# Patient Record
Sex: Female | Born: 1995
Health system: Southern US, Community
[De-identification: ages and names within clinical notes are randomized; demographics above are authoritative.]

## PROBLEM LIST (undated history)

## (undated) DIAGNOSIS — O139 Gestational [pregnancy-induced] hypertension without significant proteinuria, unspecified trimester: Secondary | ICD-10-CM

## (undated) DIAGNOSIS — O1490 Unspecified pre-eclampsia, unspecified trimester: Secondary | ICD-10-CM

## (undated) DIAGNOSIS — H9190 Unspecified hearing loss, unspecified ear: Secondary | ICD-10-CM

## (undated) DIAGNOSIS — Q8583 Von Hippel-Lindau syndrome: Secondary | ICD-10-CM

## (undated) DIAGNOSIS — K862 Cyst of pancreas: Secondary | ICD-10-CM

## (undated) DIAGNOSIS — Q858 Other phakomatoses, not elsewhere classified: Secondary | ICD-10-CM

## (undated) HISTORY — DX: Unspecified hearing loss, unspecified ear: H91.90

## (undated) HISTORY — DX: Unspecified pre-eclampsia, unspecified trimester: O14.90

## (undated) HISTORY — DX: Cyst of pancreas: K86.2

## (undated) HISTORY — DX: Gestational (pregnancy-induced) hypertension without significant proteinuria, unspecified trimester: O13.9

## (undated) HISTORY — PX: WISDOM TOOTH EXTRACTION: SHX21

---
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
## 2015-09-21 ENCOUNTER — Encounter (HOSPITAL_COMMUNITY): Payer: Self-pay | Admitting: Emergency Medicine

## 2015-09-21 ENCOUNTER — Emergency Department (HOSPITAL_COMMUNITY)
Admission: EM | Admit: 2015-09-21 | Discharge: 2015-09-22 | Disposition: A | Discharge status: Home / Self Care | Payer: 59 | Attending: Emergency Medicine | Admitting: Emergency Medicine

## 2015-09-21 ENCOUNTER — Emergency Department (HOSPITAL_COMMUNITY): Payer: 59

## 2015-09-21 DIAGNOSIS — S59912A Unspecified injury of left forearm, initial encounter: Secondary | ICD-10-CM | POA: Diagnosis not present

## 2015-09-21 DIAGNOSIS — Q858 Other phakomatoses, not elsewhere classified: Secondary | ICD-10-CM | POA: Insufficient documentation

## 2015-09-21 DIAGNOSIS — W19XXXA Unspecified fall, initial encounter: Secondary | ICD-10-CM

## 2015-09-21 DIAGNOSIS — Z79899 Other long term (current) drug therapy: Secondary | ICD-10-CM | POA: Diagnosis not present

## 2015-09-21 DIAGNOSIS — Y990 Civilian activity done for income or pay: Secondary | ICD-10-CM | POA: Diagnosis not present

## 2015-09-21 DIAGNOSIS — Y9389 Activity, other specified: Secondary | ICD-10-CM | POA: Insufficient documentation

## 2015-09-21 DIAGNOSIS — Y9289 Other specified places as the place of occurrence of the external cause: Secondary | ICD-10-CM | POA: Insufficient documentation

## 2015-09-21 DIAGNOSIS — S59902A Unspecified injury of left elbow, initial encounter: Secondary | ICD-10-CM | POA: Diagnosis not present

## 2015-09-21 DIAGNOSIS — S4992XA Unspecified injury of left shoulder and upper arm, initial encounter: Secondary | ICD-10-CM | POA: Diagnosis not present

## 2015-09-21 DIAGNOSIS — M79602 Pain in left arm: Secondary | ICD-10-CM | POA: Diagnosis not present

## 2015-09-21 DIAGNOSIS — S6992XA Unspecified injury of left wrist, hand and finger(s), initial encounter: Secondary | ICD-10-CM | POA: Diagnosis not present

## 2015-09-21 DIAGNOSIS — W01198A Fall on same level from slipping, tripping and stumbling with subsequent striking against other object, initial encounter: Secondary | ICD-10-CM | POA: Insufficient documentation

## 2015-09-21 NOTE — ED Notes (Signed)
Patient reports slipping on floor at work and hitting left arm on metal rack. Denies hitting head or LOC. C/o left elbow pain radiating to left wrist with numbness and tingling to same. Rates pain 6/10.

## 2015-09-22 DIAGNOSIS — S4992XA Unspecified injury of left shoulder and upper arm, initial encounter: Secondary | ICD-10-CM | POA: Diagnosis not present

## 2015-09-22 DIAGNOSIS — Q858 Other phakomatoses, not elsewhere classified: Secondary | ICD-10-CM | POA: Diagnosis not present

## 2015-09-22 DIAGNOSIS — S6992XA Unspecified injury of left wrist, hand and finger(s), initial encounter: Secondary | ICD-10-CM | POA: Diagnosis not present

## 2015-09-22 DIAGNOSIS — S59902A Unspecified injury of left elbow, initial encounter: Secondary | ICD-10-CM | POA: Diagnosis not present

## 2015-09-22 DIAGNOSIS — Z79899 Other long term (current) drug therapy: Secondary | ICD-10-CM | POA: Diagnosis not present

## 2015-09-22 MED ORDER — IBUPROFEN 200 MG PO TABS
400.0000 mg | ORAL_TABLET | Freq: Once | ORAL | Status: AC
  Administered 2015-09-22: 400 mg via ORAL
  Filled 2015-09-22: qty 2

## 2015-09-22 MED ORDER — OXYCODONE-ACETAMINOPHEN 5-325 MG PO TABS: 1.0000 | ORAL_TABLET | ORAL | Status: DC | PRN

## 2015-09-22 MED ORDER — IBUPROFEN 800 MG PO TABS: 800.0000 mg | ORAL_TABLET | Freq: Three times a day (TID) | ORAL | Status: DC

## 2015-09-22 NOTE — Discharge Instructions (Signed)
You have been seen today for arm pain. Your imaging showed no abnormalities. Follow up with orthopedics on this matter. Follow up with PCP as needed. Return to ED should symptoms worsen.   Emergency Department Resource Guide 1) Find a Doctor and Pay Out of Pocket Although you won't have to find out who is covered by your insurance plan, it is a good idea to ask around and get recommendations. You will then need to call the office and see if the doctor you have chosen will accept you as a new patient and what types of options they offer for patients who are self-pay. Some doctors offer discounts or will set up payment plans for their patients who do not have insurance, but you will need to ask so you aren't surprised when you get to your appointment.  2) Contact Your Local Health Department Not all health departments have doctors that can see patients for sick visits, but many do, so it is worth a call to see if yours does. If you don't know where your local health department is, you can check in your phone book. The CDC also has a tool to help you locate your state's health department, and many state websites also have listings of all of their local health departments.  3) Find a [facility] If your illness is not likely to be very severe or complicated, you may want to try a walk in clinic. These are popping up all over the country in pharmacies, drugstores, and shopping centers. They're usually staffed by nurse practitioners or physician assistants that have been trained to treat common illnesses and complaints. They're usually fairly quick and inexpensive. However, if you have serious medical issues or chronic medical problems, these are probably not your best option.  No Primary Care Doctor: - Call Health Connect at  [phone] - they can help you locate a primary care doctor that  accepts your insurance, provides certain services, etc. - Physician Referral Service- [phone]  Chronic Pain  Problems: Organization         Address  Phone   Notes  [facility]  [phone] Patients need to be referred by their primary care doctor.   Medication Assistance: Organization         Address  Phone   Notes  [facility] Medication [facility] [place], [place] [address] [phone] --Must be a resident of [facility] -- Must have NO insurance coverage whatsoever (no Medicaid/ Medicare, etc.) -- The pt. MUST have a primary care doctor that directs their care regularly and follows them in the community   MedAssist  [phone]   [employer]  [phone]    Agencies that provide inexpensive medical care: Organization         Address  Phone   Notes  [place]  [phone]   [facility]    [phone]   [facility] [place], [place] [address] [phone]   [place] [address], [place] [phone]   Planned Parenthood    [phone]   [facility]    [phone]   [place] and [address], [place] Phone:  [phone], Fax:  [phone] Hours of Operation:  9 am - 6 pm, M-F.  Also accepts Medicaid/Medicare and self-pay.  [facility]  [place] [employer], [address], [place] Phone: [phone], Fax: [phone]. Hours of Operation:  8:30 am - 5:30 pm, M-F.  Also accepts Medicaid and self-pay.  [facility] [place] [address], [place] Phone: [phone]   [place], [place], [place] [phone], Ext. [phone] Mondays & Thursdays: 7-9 AM.  First 15 patients are seen on a first come, first serve basis.    [place] Providers:  Organization         Address  Phone   Notes  [facility] [address], [place] [phone] Also  accepts self-pay patients.  [facility] [address], [place]  [phone]   [place], [address], [place] [phone]   [facility] [address], [place] [phone]   Lucianne Lei [address], [place]   [phone] Only accepts [place] Access [place] patients after they have their name applied to their card.   Self-Pay (no insurance) in [facility]:  Organization         Address  Phone   Notes  Sickle Cell Patients, [facility] [place] [phone]   [facility] Urgent Care [place] [phone]   [facility] [place]  [place], [place], [place] [phone]   [facility]/Dr. Osei-Bonsu  [address] [place] or [address], [place] [phone] Phone number for both [place] and [place] locations is the same.  Urgent Medical and [facility] [address], [place] [phone]   [facility] [address] [place] or [address] [phone]   [facility] [address] [place] [phone], phone; [phone], fax Sees patients 1st and 3rd Saturday of every month.  Must not qualify for public or private insurance (i.e. Medicaid, Medicare, [place] Health Choice, Veterans' Benefits)  Household income should be no more than 200% of the poverty level The clinic cannot treat you if you are pregnant or think you are pregnant  Sexually transmitted diseases are not treated at the clinic.    Dental Care: Organization         Address  Phone  Notes  [facility] Department of [facility] [place] [phone] Accepts children up to age 37 who are enrolled in [place] or [place]; pregnant  women with a Medicaid card; and children who have applied for Medicaid or [place] Health Choice, but were declined, whose parents can pay a reduced fee at time of service.  [facility] Department of [facility]  [address], [place] [phone] Accepts children up to age 58 who are enrolled in [place] or [place]; pregnant women with a Medicaid card; and children who have applied for Medicaid or [place] Health Choice, but were declined, whose parents can pay a reduced fee at time of service.  [facility] Adult [facility] Access PROGRAM  [place] [phone] Patients are seen by appointment only. Walk-ins are not accepted. [place] will see patients 22 years of age and older. Monday - Tuesday (8am-5pm) Most Wednesdays (8:30-5pm) $30 per visit, cash only  [place] Adult  Dental Access PROGRAM  [address], [facility] [phone] Patients are seen by appointment only. Walk-ins are not accepted. [place] will see patients 60 years of age and older. One Wednesday Evening (Monthly: Volunteer Based).  $30 per visit, cash only  [place]  [phone] for adults; Children under age 4, call Graduate Pediatric Dentistry at [phone]. Children aged 49-14, please call [phone] to request a pediatric application.  Dental services are provided in all areas of dental care including fillings, crowns and bridges, complete and partial dentures, implants, gum treatment, root canals, and extractions. Preventive care is also provided. Treatment is provided to both adults and children. Patients are selected via a lottery and there is often a waiting list.   [facility] [address] [place]  [phone] [email or web address]   [facility] [address] [place], [place] [phone], Ext. [phone] Second and Fourth Thursday of each month, opens at 6:30 AM; Clinic ends at 9 AM.  Patients are  seen on a first-come first-served basis, and a limited number are seen during each clinic.   [facility]  [address] Hillard Danker [place], [place] [phone]   Eligibility Requirements You must have lived in [place], [place], or [place] counties for at least the last three months.   You cannot be eligible for state or federal sponsored [employer], including [employer], [place], or [employer].   You generally cannot be eligible for healthcare insurance through your employer.    How to apply: Eligibility screenings are held every Tuesday and Wednesday afternoon from 1:00 pm until 4:00 pm. You do not need an appointment for the interview!  [facility] [address] [place], [place]   [place]  [place] Department  [place]  [phone]    Behavioral Health Resources in the Community: Intensive Outpatient Programs Organization         Address  Phone  Notes  [place]. [address], [place], [place] [phone]   [facility] Outpatient [address], [place], [place]   ADS: Alcohol & Drug Svcs [address], [place], [place]   [place] [address],  [place], [place] or [phone]   Substance Abuse Resources Organization         Address  Phone  Notes  Alcohol and Drug Services  [phone]   [place]  [phone]   [facility] [place]   Chinita Pester  [phone]   Residential & Outpatient Substance Abuse Program  [phone]   Psychological Services Organization         Address  Phone  Notes  [facility] [place]  [place]  [phone]   [place] [address] [place] [phone] or [phone]    Mobile Crisis  Teams Organization         Address  Phone  Notes  [facility], Mobile Crisis Care Unit  [phone]   [facility] Psychotherapeutic Services  [address] [place], [place]   [place] Levels [address] [place] [phone]    Self-Help/Support Groups Organization         Address  Phone             Notes  Mental  Health Assoc. of [place] - variety of support groups  [place] Call for more information  Narcotics Anonymous (NA), [facility] [address], [employer] [place]  2 meetings at this location   Special educational needs teacher         Address  Phone  Notes  [facility] Residential Treatment [place],    [place]  [phone]   [facility]  [address], [place] [address], [place], [place]   [place], [place] [phone] Admissions: 8am-3pm M-F  Incentives Substance [place] [address],    [place], [place] [phone]   [facility] [address] [place], [place], [place]   [facility] [address]  [place], [place]   [facility] Programs - Intensive Outpatient [address], [place], [place]   [facility] ([place].,  [place], [place] [phone] or [phone]   [facility]idential Treatment Services (RTS) [address] [place], [place] Accepts Medicaid  [facility] [place] [address],  [place] [phone] Substance Abuse/Addiction Treatment   [facility] Organization         Address  Phone  Notes  [facility]  [phone]   Domenic Schwab, PhD [address] Arlis [place], [place]   [phone] or [phone]   [place], [place] [phone]   [facility] [address], [place], [place] [phone]  Insurance/Medicaid/sponsorship through [facility] and Families [address]                                    [place], [place] [phone] [place] [address], [place] [phone]    Dr. Adele Schilder  [phone]   [facility] Dept. 1) [address] [place] 2) [place] 3)  [place] [address], [place] [phone]  [phone]   [place] [phone] or [phone] (After Hours)

## 2015-09-22 NOTE — ED Provider Notes (Signed)
CSN: [id number]     Arrival date & time 09/21/15  2304 History   First MD Initiated Contact with Patient 09/22/15 0033     No chief complaint on file.    (Consider location/radiation/quality/duration/timing/severity/associated sxs/prior Treatment) HPI   LORINA DENDY is a 20 y.o. female, with a history of VHL, presenting to the ED with left arm pain. Patient states that she slipped while at work and hit her left arm on a metal rack. Patient rates her pain at 3 out of 10, throbbing, located mostly in the left elbow and left wrist, and nonradiating. Patient has not taken anything for the pain. Patient denies LOC, head trauma, neuro or functional deficits, or any other pain or complaints.    Past Medical History  Diagnosis Date  . VHL (von Hippel-Lindau syndrome) ([place])    History reviewed. No pertinent past surgical history. No family history on file. Social History  Substance Use Topics  . Smoking status: Never Smoker   . Smokeless tobacco: None  . Alcohol Use: No   OB History    No data available     Review of Systems  Musculoskeletal: Positive for arthralgias (Left elbow and left wrist). Negative for back pain and neck pain.      Allergies  Cephalexin  Home Medications   Prior to Admission medications   Medication Sig Start Date End Date Taking? Authorizing Provider  amphetamine-dextroamphetamine (ADDERALL XR) 20 MG 24 hr capsule Take 1 capsule by mouth daily. 08/18/15  Yes Historical Provider, MD  buPROPion (WELLBUTRIN XL) 150 MG 24 hr tablet Take 1 tablet by mouth daily. 08/07/15  Yes Historical Provider, MD  escitalopram (LEXAPRO) 10 MG tablet Take 1 tablet by mouth daily. 08/07/15  Yes Historical Provider, MD  etonogestrel-ethinyl estradiol (NUVARING) 0.12-0.015 MG/24HR vaginal ring Place 1 each vaginally every 28 (twenty-eight) days. Insert vaginally and leave in place for 3 consecutive weeks, then remove for 1 week.   Yes Historical Provider, MD  ibuprofen  (ADVIL,MOTRIN) 800 MG tablet Take 1 tablet (800 mg total) by mouth 3 (three) times daily. 09/22/15   Shawn C Joy, PA-C  oxyCODONE-acetaminophen (PERCOCET/ROXICET) 5-325 MG tablet Take 1-2 tablets by mouth every 4 (four) hours as needed for severe pain. 09/22/15   Lorayne Bender, PA-C   LMP 09/07/2015 Physical Exam  Constitutional: She appears well-developed and well-nourished.  HENT:  Head: Normocephalic and atraumatic.  Neck: Normal range of motion.  Cardiovascular: Normal rate.   Pulmonary/Chest: Effort normal.  Musculoskeletal:  Full ROM in all extremities and spine. No paraspinal tenderness. No swelling, erythema, or deformities noted in the left arm. Patient complains of pain to the medial elbow with upper arm flexion as well as pain to the lateral wrist with flexion and extension of the wrist.  Neurological: She is alert.  Skin: Skin is warm and dry.  Nursing note and vitals reviewed.   ED Course  Procedures (including critical care time) Labs Review Labs Reviewed - No data to display  Imaging Review Dg Forearm Left  09/22/2015  CLINICAL DATA:  20 year old female with fall and trauma to the left forearm. EXAM: LEFT FOREARM - 2 VIEW COMPARISON:  None. FINDINGS: There is no evidence of fracture or other focal bone lesions. Soft tissues are unremarkable. IMPRESSION: Negative. Electronically Signed   By: Anner Crete M.D.   On: 09/22/2015 00:13   I have personally reviewed and evaluated these images as part of my medical decision-making.   EKG Interpretation None  MDM   Final diagnoses:  Pain of left upper extremity  Fall, initial encounter    TSERING LAMPKIN presents with a slip and fall resulting in left arm pain.  Patient has no functional or neuro deficits. Patient has no signs of any other injuries. Patient is appropriate for outpatient orthopedic follow-up. The patient was given instructions for home care as well as return precautions. Patient voices  understanding of these instructions, accepts the plan, and is comfortable with discharge.    Lorayne Bender, PA-C [phone]  Delora Fuel, MD [phone]

## 2015-12-15 MED FILL — BUPROPION HCL XL 300 MG TAB: 300 | 90 days supply | Qty: 90 | Fill #0

## 2015-12-15 MED FILL — DEXTROAMP-AMPHET ER 20 MG C: 20 | 90 days supply | Qty: 90 | Fill #0

## 2015-12-22 ENCOUNTER — Ambulatory Visit (HOSPITAL_COMMUNITY)
Admission: RE | Admit: 2015-12-22 | Discharge: 2015-12-22 | Disposition: A | Discharge status: Home / Self Care | Payer: 59 | Source: Ambulatory Visit | Attending: Physician Assistant | Admitting: Physician Assistant

## 2015-12-22 ENCOUNTER — Other Ambulatory Visit (HOSPITAL_COMMUNITY): Payer: Self-pay | Admitting: Physician Assistant

## 2015-12-22 DIAGNOSIS — R7611 Nonspecific reaction to tuberculin skin test without active tuberculosis: Secondary | ICD-10-CM | POA: Diagnosis not present

## 2015-12-25 MED FILL — AZITHROMYCIN 250 MG TABLET: 250 | 5 days supply | Qty: 6 | Fill #0

## 2015-12-25 MED FILL — NOREL AD TABLET: 4-10-325 | 10 days supply | Qty: 20 | Fill #0

## 2016-01-18 DIAGNOSIS — Z0000 Encounter for general adult medical examination without abnormal findings: Secondary | ICD-10-CM | POA: Diagnosis not present

## 2016-01-18 DIAGNOSIS — Z6822 Body mass index (BMI) 22.0-22.9, adult: Secondary | ICD-10-CM | POA: Diagnosis not present

## 2016-01-18 DIAGNOSIS — Z020 Encounter for examination for admission to educational institution: Secondary | ICD-10-CM | POA: Diagnosis not present

## 2016-01-29 MED FILL — ESCITALOPRAM 10 MG TABLET: 10 | 90 days supply | Qty: 90 | Fill #1

## 2016-03-15 DIAGNOSIS — R5383 Other fatigue: Secondary | ICD-10-CM | POA: Diagnosis not present

## 2016-03-18 DIAGNOSIS — R5383 Other fatigue: Secondary | ICD-10-CM | POA: Diagnosis not present

## 2016-07-30 MED FILL — DEXTROAMP-AMPHET ER 20 MG C: 20 | 30 days supply | Qty: 30 | Fill #0

## 2016-09-05 MED FILL — DEXTROAMP-AMPHET ER 20 MG C: 20 | 30 days supply | Qty: 30 | Fill #0

## 2016-10-07 MED FILL — ADDERALL XR 20 MG CAP SA: 20 | 30 days supply | Qty: 30 | Fill #0

## 2016-10-31 DIAGNOSIS — H5213 Myopia, bilateral: Secondary | ICD-10-CM | POA: Diagnosis not present

## 2016-11-06 MED FILL — ADDERALL XR 20 MG CAP SA: 20 | 30 days supply | Qty: 30 | Fill #0

## 2016-12-04 MED FILL — ADDERALL XR 20 MG CAP SA: 20 | 30 days supply | Qty: 30 | Fill #0

## 2016-12-04 MED FILL — MONO-LINYAH 28 TABLET: 0.25-35 | 28 days supply | Qty: 28 | Fill #0

## 2017-01-03 ENCOUNTER — Ambulatory Visit (HOSPITAL_COMMUNITY)
Admission: RE | Admit: 2017-01-03 | Discharge: 2017-01-03 | Disposition: A | Discharge status: Home / Self Care | Payer: 59 | Source: Ambulatory Visit | Attending: Family Medicine | Admitting: Family Medicine

## 2017-01-03 ENCOUNTER — Other Ambulatory Visit (HOSPITAL_COMMUNITY): Payer: Self-pay | Admitting: Family Medicine

## 2017-01-03 DIAGNOSIS — R7611 Nonspecific reaction to tuberculin skin test without active tuberculosis: Secondary | ICD-10-CM | POA: Diagnosis not present

## 2017-01-06 MED FILL — ADDERALL XR 20 MG CAP SA: 20 | 30 days supply | Qty: 30 | Fill #0

## 2017-01-13 MED FILL — MONO-LINYAH 28 TABLET: 0.25-35 | 84 days supply | Qty: 84 | Fill #1

## 2017-02-06 MED FILL — ADDERALL XR 20 MG CAP SA: 20 | 30 days supply | Qty: 30 | Fill #0

## 2017-03-06 MED FILL — ADDERALL XR 20 MG CAP SA: 20 | 30 days supply | Qty: 30 | Fill #0

## 2017-04-03 MED FILL — ADDERALL XR 20 MG CAP SA: 20 | 30 days supply | Qty: 30 | Fill #0

## 2017-05-08 DIAGNOSIS — F900 Attention-deficit hyperactivity disorder, predominantly inattentive type: Secondary | ICD-10-CM | POA: Diagnosis not present

## 2017-05-08 MED FILL — ADDERALL XR 30 MG CAP SA: 30 | 30 days supply | Qty: 30 | Fill #0

## 2017-06-06 MED FILL — ADDERALL XR 30 MG CAP SA: 30 | 30 days supply | Qty: 30 | Fill #0

## 2017-07-02 MED FILL — ADDERALL XR 30 MG CAP SA: 30 | 30 days supply | Qty: 30 | Fill #0

## 2017-07-14 DIAGNOSIS — L700 Acne vulgaris: Secondary | ICD-10-CM | POA: Diagnosis not present

## 2017-07-29 DIAGNOSIS — Z0000 Encounter for general adult medical examination without abnormal findings: Secondary | ICD-10-CM | POA: Diagnosis not present

## 2017-07-29 DIAGNOSIS — Z6825 Body mass index (BMI) 25.0-25.9, adult: Secondary | ICD-10-CM | POA: Diagnosis not present

## 2017-07-30 MED FILL — ADDERALL XR 30 MG CAP SA: 30 | 30 days supply | Qty: 30 | Fill #0

## 2017-08-11 DIAGNOSIS — L700 Acne vulgaris: Secondary | ICD-10-CM | POA: Diagnosis not present

## 2017-08-25 MED FILL — NORG-ETHIN ESTRA 0.25-0.035: 0.25-35 | 56 days supply | Qty: 56 | Fill #2

## 2017-08-28 MED FILL — ADDERALL XR 30 MG CAP SA: 30 | 30 days supply | Qty: 30 | Fill #0

## 2017-09-25 MED FILL — ADDERALL XR 30 MG CAP SA: 30 | 30 days supply | Qty: 30 | Fill #0

## 2017-10-23 MED FILL — ADDERALL XR 30 MG CAP SA: 30 | 30 days supply | Qty: 30 | Fill #0

## 2017-11-20 MED FILL — ADDERALL XR 20 MG CAP SA: 20 | 30 days supply | Qty: 30 | Fill #0

## 2017-12-15 MED FILL — ADDERALL XR 20 MG CAP SA: 20 | 30 days supply | Qty: 30 | Fill #0

## 2018-01-12 MED FILL — ADDERALL XR 20 MG CAP SA: 20 | 30 days supply | Qty: 30 | Fill #0

## 2018-02-10 MED FILL — ADDERALL XR 20 MG CAP SA: 20 | 30 days supply | Qty: 30 | Fill #0

## 2018-03-10 MED FILL — ADDERALL XR 20 MG CAP SA: 20 | 30 days supply | Qty: 30 | Fill #0

## 2018-04-07 MED FILL — ADDERALL XR 20 MG CAP SA: 20 | 30 days supply | Qty: 30 | Fill #0

## 2018-05-05 MED FILL — ADDERALL XR 20 MG CAP SA: 20 | 30 days supply | Qty: 30 | Fill #0

## 2018-06-19 MED FILL — PROMETHAZINE 25 MG TABLET: 25 | 1 days supply | Qty: 8 | Fill #0

## 2018-06-19 MED FILL — ACETAMINOPHEN/COD #3 TABLET: 300-30 | 1 days supply | Qty: 6 | Fill #0

## 2018-06-19 MED FILL — miSOPROStol 200 MCG TABS: 200 | 1 days supply | Qty: 4 | Fill #0

## 2018-06-23 MED FILL — ADDERALL XR 20 MG CAP SA: 20 | 30 days supply | Qty: 30 | Fill #0

## 2018-07-22 MED FILL — ADDERALL XR 20 MG CAP SA: 20 | 30 days supply | Qty: 30 | Fill #0

## 2018-08-20 MED FILL — ADDERALL XR 20 MG CAP SA: 20 | 30 days supply | Qty: 30 | Fill #0

## 2018-09-17 MED FILL — ADDERALL XR 20 MG CAP SA: 20 | 30 days supply | Qty: 30 | Fill #0

## 2018-10-15 MED FILL — ADDERALL XR 20 MG CAP SA: 20 | 30 days supply | Qty: 30 | Fill #0

## 2018-10-18 ENCOUNTER — Ambulatory Visit (INDEPENDENT_AMBULATORY_CARE_PROVIDER_SITE_OTHER): Payer: Self-pay | Admitting: Family Medicine

## 2018-10-18 ENCOUNTER — Encounter: Payer: Self-pay | Admitting: Family Medicine

## 2018-10-18 VITALS — BP 100/68 | HR 92 | Temp 98.5°F | Resp 16 | Wt 169.8 lb

## 2018-10-18 DIAGNOSIS — M545 Low back pain, unspecified: Secondary | ICD-10-CM

## 2018-10-18 MED ORDER — CYCLOBENZAPRINE HCL 10 MG PO TABS: 10.0000 mg | ORAL_TABLET | Freq: Three times a day (TID) | ORAL | 0 refills | Status: DC | PRN

## 2018-10-18 MED ORDER — KETOROLAC TROMETHAMINE 30 MG/ML IJ SOLN
30.0000 mg | Freq: Once | INTRAMUSCULAR | Status: AC
  Administered 2018-10-22: 30 mg via INTRAMUSCULAR

## 2018-10-18 MED ORDER — LIDOCAINE 5 % EX PTCH: 1.0000 | MEDICATED_PATCH | CUTANEOUS | 0 refills | Status: DC

## 2018-10-18 NOTE — Patient Instructions (Signed)
PLAN< Take medication as prescribed may cause drowsiness Non pharm treatment options- Tylenol, Heat pack, icy hot, ben gay, warm epsom salt soaks.   Acute Back Pain, Adult Acute back pain is sudden and usually short-lived. It is often caused by an injury to the muscles and tissues in the back. The injury may result from:  A muscle or ligament getting overstretched or torn (strained). Ligaments are tissues that connect bones to each other. Lifting something improperly can cause a back strain.  Wear and tear (degeneration) of the spinal disks. Spinal disks are circular tissue that provides cushioning between the bones of the spine (vertebrae).  Twisting motions, such as while playing sports or doing yard work.  A hit to the back.  Arthritis. You may have a physical exam, lab tests, and imaging tests to find the cause of your pain. Acute back pain usually goes away with rest and home care. Follow these instructions at home: Managing pain, stiffness, and swelling  Take over-the-counter and prescription medicines only as told by your health care provider.  Your health care provider may recommend applying ice during the first 24-48 hours after your pain starts. To do this: ? Put ice in a plastic bag. ? Place a towel between your skin and the bag. ? Leave the ice on for 20 minutes, 2-3 times a day.  If directed, apply heat to the affected area as often as told by your health care provider. Use the heat source that your health care provider recommends, such as a moist heat pack or a heating pad. ? Place a towel between your skin and the heat source. ? Leave the heat on for 20-30 minutes. ? Remove the heat if your skin turns bright red. This is especially important if you are unable to feel pain, heat, or cold. You have a greater risk of getting burned. Activity   Do not stay in bed. Staying in bed for more than 1-2 days can delay your recovery.  Sit up and stand up straight. Avoid  leaning forward when you sit, or hunching over when you stand. ? If you work at a desk, sit close to it so you do not need to lean over. Keep your chin tucked in. Keep your neck drawn back, and keep your elbows bent at a right angle. Your arms should look like the letter "L." ? Sit high and close to the steering wheel when you drive. Add lower back (lumbar) support to your car seat, if needed.  Take short walks on even surfaces as soon as you are able. Try to increase the length of time you walk each day.  Do not sit, drive, or stand in one place for more than 30 minutes at a time. Sitting or standing for long periods of time can put stress on your back.  Do not drive or use heavy machinery while taking prescription pain medicine.  Use proper lifting techniques. When you bend and lift, use positions that put less stress on your back: ? [place] your knees. ? Keep the load close to your body. ? Avoid twisting.  Exercise regularly as told by your health care provider. Exercising helps your back heal faster and helps prevent back injuries by keeping muscles strong and flexible.  Work with a physical therapist to make a safe exercise program, as recommended by your health care provider. Do any exercises as told by your physical therapist. Lifestyle  Maintain a healthy weight. Extra weight puts stress on your back  and makes it difficult to have good posture.  Avoid activities or situations that make you feel anxious or stressed. Stress and anxiety increase muscle tension and can make back pain worse. Learn ways to manage anxiety and stress, such as through exercise. General instructions  Sleep on a firm mattress in a comfortable position. Try lying on your side with your knees slightly bent. If you lie on your back, put a pillow under your knees.  Follow your treatment plan as told by your health care provider. This may include: ? Cognitive or behavioral therapy. ? Acupuncture or massage  therapy. ? Meditation or yoga. Contact a health care provider if:  You have pain that is not relieved with rest or medicine.  You have increasing pain going down into your legs or buttocks.  Your pain does not improve after 2 weeks.  You have pain at night.  You lose weight without trying.  You have a fever or chills. Get help right away if:  You develop new bowel or bladder control problems.  You have unusual weakness or numbness in your arms or legs.  You develop nausea or vomiting.  You develop abdominal pain.  You feel faint. Summary  Acute back pain is sudden and usually short-lived.  Use proper lifting techniques. When you bend and lift, use positions that put less stress on your back.  Take over-the-counter and prescription medicines and apply heat or ice as directed by your health care provider. This information is not intended to replace advice given to you by your health care provider. Make sure you discuss any questions you have with your health care provider. Document Released: 08/26/2005 Document Revised: 04/02/2018 Document Reviewed: 04/09/2017 Elsevier Interactive Patient Education  2019 [employer].

## 2018-10-18 NOTE — Progress Notes (Signed)
Nicole Chase is a 23 y.o. female who presents today with 1 days of lower back pain more concentrated on the right side. 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 She has been using over the counter motrin for pain relief and reports that this is only mildly effective. She has already missed one day of work related to this condition. She reports that she bent down to pick something up and when she rose she felt the pain. She denies any loss of bowel or bladder control or that the pain radiates. She denies any symptoms of numbness or tingling and rates her current pain a 5 on a scale of 10.     Review of Systems  Constitutional: Negative for chills, fever and malaise/fatigue.  HENT: Negative for congestion, ear discharge, ear pain, sinus pain and sore throat.   Eyes: Negative.   Respiratory: Negative for cough, sputum production and shortness of breath.   Cardiovascular: Negative.  Negative for chest pain.  Gastrointestinal: Negative for abdominal pain, diarrhea, nausea and vomiting.  Genitourinary: Negative for dysuria, frequency, hematuria and urgency.  Musculoskeletal: Positive for back pain. Negative for myalgias.       Right sided- denies trauma  Skin: Negative.   Neurological: Negative for headaches.  Endo/Heme/Allergies: Negative.   Psychiatric/Behavioral: Negative.     Nicole Chase has a current medication list which includes the following prescription(s): amphetamine-dextroamphetamine, ibuprofen, bupropion, cyclobenzaprine, escitalopram, etonogestrel-ethinyl estradiol, lidocaine, and oxycodone-acetaminophen, and the following Facility-Administered Medications: ketorolac. Also is allergic to cephalexin.  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 Also  has no past surgical history on file.    O: Vitals:   10/18/18 1105  BP:  100/68  Pulse: 92  Resp: 16  Temp: 98.5 F (36.9 C)  SpO2: 99%     Physical Exam Vitals signs reviewed.  Constitutional:      Appearance: She is well-developed. She is not toxic-appearing.  HENT:     Head: Normocephalic.     Right Ear: Hearing, tympanic membrane, ear canal and external ear normal.     Left Ear: Hearing, tympanic membrane, ear canal and external ear normal.     Nose: Nose normal.     Mouth/Throat:     Pharynx: Uvula midline.  Neck:     Musculoskeletal: Normal range of motion and neck supple.  Cardiovascular:     Rate and Rhythm: Normal rate and regular rhythm.     Pulses: Normal pulses.     Heart sounds: Normal heart sounds.  Pulmonary:     Effort: Pulmonary effort is normal.     Breath sounds: Normal breath sounds. No decreased breath sounds, wheezing, rhonchi or rales.  Abdominal:     General: Bowel sounds are normal.     Palpations: Abdomen is soft.  Musculoskeletal: Normal range of motion.     Lumbar back: She exhibits pain and spasm. She exhibits normal range of motion, no tenderness, no bony tenderness, no swelling, no edema, no deformity, no laceration and normal pulse.     Comments: Pain easily reproducible with palpation of lumbar right side in office. No visual deformities or evidence  Of trauma on visualization. Patient has full ROM with some reports of mild pain- she is able to bend and walk without antalgic gait. Strength WNL and full ROM of both feet.  Lymphadenopathy:  Head:     Right side of head: No submental or submandibular adenopathy.     Left side of head: No submental or submandibular adenopathy.     Cervical: No cervical adenopathy.  Neurological:     Mental Status: She is alert and oriented to person, place, and time.    A: 1. Acute right-sided low back pain without sciatica    P: 1. Acute right-sided low back pain without sciatica Self limiting acute back strain- suspect will recover without difficulty with 7-10 days of  supportive care- NSAID okay as pt reports she is not taking SSRI as her medication reconciliation shows- discussed the benefit of using motrin and non-pharmacologic treatment options like heat, warm soaks, topical applications, rest and light stretching. Declined work note- discussed risk of drowsiness with Flexeril- patient v/u-  No acute concerns or back pain red flags on exam.  - ketorolac (TORADOL) 30 MG/ML injection 30 mg - cyclobenzaprine (FLEXERIL) 10 MG tablet; Take 1 tablet (10 mg total) by mouth 3 (three) times daily as needed for muscle spasms. - lidocaine (LIDODERM) 5 %; Place 1 patch onto the skin daily. Remove & Discard patch within 12 hours or as directed by MD   Discussed with patient exam findings, suspected diagnosis etiology and  reviewed recommended treatment plan and follow up, including complications and indications for urgent medical follow up and evaluation. Medications including use and indications reviewed with patient. Patient provided relevant patient education on diagnosis and/or relevant related condition that were discussed and reviewed with patient at discharge. Patient verbalized understanding of information provided and agrees with plan of care (POC), all questions answered.

## 2018-11-12 MED FILL — ADDERALL XR 20 MG CAP SA: 20 | 30 days supply | Qty: 30 | Fill #0

## 2018-11-17 ENCOUNTER — Ambulatory Visit (INDEPENDENT_AMBULATORY_CARE_PROVIDER_SITE_OTHER): Payer: Self-pay | Admitting: Physician Assistant

## 2018-11-17 ENCOUNTER — Encounter: Payer: Self-pay | Admitting: Physician Assistant

## 2018-11-17 VITALS — BP 115/68 | HR 97 | Temp 98.1°F | Resp 16 | Wt 165.4 lb

## 2018-11-17 DIAGNOSIS — R300 Dysuria: Secondary | ICD-10-CM

## 2018-11-17 DIAGNOSIS — R82998 Other abnormal findings in urine: Secondary | ICD-10-CM

## 2018-11-17 DIAGNOSIS — N926 Irregular menstruation, unspecified: Secondary | ICD-10-CM

## 2018-11-17 LAB — POCT URINALYSIS DIPSTICK
BILIRUBIN UA: NEGATIVE
Glucose, UA: NEGATIVE
NITRITE UA: NEGATIVE
PROTEIN UA: NEGATIVE
SPEC GRAV UA: 1.01 (ref 1.010–1.025)
UROBILINOGEN UA: 0.2 U/dL
pH, UA: 6 (ref 5.0–8.0)

## 2018-11-17 LAB — POCT URINE PREGNANCY: Preg Test, Ur: NEGATIVE

## 2018-11-17 MED ORDER — NITROFURANTOIN MONOHYD MACRO 100 MG PO CAPS: 100.0000 mg | ORAL_CAPSULE | Freq: Two times a day (BID) | ORAL | 0 refills | Status: AC

## 2018-11-17 NOTE — Progress Notes (Signed)
PO

## 2018-11-17 NOTE — Patient Instructions (Signed)
Urinary Tract Infection, Adult   Your results indicate you have a UTI. I have given you a prescription for an antibiotic. Please take with food. We do not obtain urine cultures at our office; therefore, if your symptoms worsen over the next 24-48 hours or you develop fever, chills, flank pain, nausea and vomiting, please seek care immediately.     A urinary tract infection (UTI) is an infection of any part of the urinary tract. The urinary tract includes:  The kidneys.  The ureters.  The bladder.  The urethra. These organs make, store, and get rid of pee (urine) in the body. What are the causes? This is caused by germs (bacteria) in your genital area. These germs grow and cause swelling (inflammation) of your urinary tract. What increases the risk? You are more likely to develop this condition if:  You have a small, thin tube (catheter) to drain pee.  You cannot control when you pee or poop (incontinence).  You are female, and: ? You use these methods to prevent pregnancy: ? A medicine that kills sperm (spermicide). ? A device that blocks sperm (diaphragm). ? You have low levels of a female hormone (estrogen). ? You are pregnant.  You have genes that add to your risk.  You are sexually active.  You take antibiotic medicines.  You have trouble peeing because of: ? A prostate that is bigger than normal, if you are female. ? A blockage in the part of your body that drains pee from the bladder (urethra). ? A kidney stone. ? A nerve condition that affects your bladder (neurogenic bladder). ? Not getting enough to drink. ? Not peeing often enough.  You have other conditions, such as: ? Diabetes. ? A weak disease-fighting system (immune system). ? Sickle cell disease. ? Gout. ? Injury of the spine. What are the signs or symptoms? Symptoms of this condition include:  Needing to pee right away (urgently).  Peeing often.  Peeing small amounts often.  Pain or burning  when peeing.  Blood in the pee.  Pee that smells bad or not like normal.  Trouble peeing.  Pee that is cloudy.  Fluid coming from the vagina, if you are female.  Pain in the belly or lower back. Other symptoms include:  Throwing up (vomiting).  No urge to eat.  Feeling mixed up (confused).  Being tired and grouchy (irritable).  A fever.  Watery poop (diarrhea). How is this treated? This condition may be treated with:  Antibiotic medicine.  Other medicines.  Drinking enough water. Follow these instructions at home:  Medicines  Take over-the-counter and prescription medicines only as told by your doctor.  If you were prescribed an antibiotic medicine, take it as told by your doctor. Do not stop taking it even if you start to feel better. General instructions  Make sure you: ? Pee until your bladder is empty. ? Do not hold pee for a long time. ? Empty your bladder after sex. ? Wipe from front to back after pooping if you are a female. Use each tissue one time when you wipe.  Drink enough fluid to keep your pee pale yellow.  Keep all follow-up visits as told by your doctor. This is important. Contact a doctor if:  You do not get better after 1-2 days.  Your symptoms go away and then come back. Get help right away if:  You have very bad back pain.  You have very bad pain in your lower belly.  You   have a fever.  You are sick to your stomach (nauseous).  You are throwing up. Summary  A urinary tract infection (UTI) is an infection of any part of the urinary tract.  This condition is caused by germs in your genital area.  There are many risk factors for a UTI. These include having a small, thin tube to drain pee and not being able to control when you pee or poop.  Treatment includes antibiotic medicines for germs.  Drink enough fluid to keep your pee pale yellow. This information is not intended to replace advice given to you by your health care  provider. Make sure you discuss any questions you have with your health care provider. Document Released: 02/12/2008 Document Revised: 03/05/2018 Document Reviewed: 03/05/2018 Elsevier Interactive Patient Education  2019 [employer]  

## 2018-11-17 NOTE — Progress Notes (Signed)
11/17/2018 at 1:31 PM  Nicole Chase / DOB: 03/29/1996 / MRN: [id number]  The patient does not have a problem list on file.  SUBJECTIVE  Nicole Chase is a 23 y.o. female who complains of dysuria, urinary frequency and urinary urgency x 1 day. She denies hematuria, flank pain, abdominal pain, pelvic pain, genital rash, genital irritation and vaginal discharge. Has not tried anything for relief. Most recent UTI prior to this was last year. LMP ~5 weeks ago. Has irregular cycles. Not on OCP. Sexually active with monogamous husband. Allergic to kelfex. Currently on ketone diet.   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    Medications reviewed and updated by myself where necessary, and exist elsewhere in the encounter.   [name] is allergic to cephalexin. She  reports that she has never smoked. She does not have any smokeless tobacco history on file. She reports that she does not drink alcohol or use drugs. She  reports never being sexually active. The patient  has no past surgical history on file.  Her family history is not on file.  Review of Systems  Constitutional: Negative for chills, diaphoresis and fever.  Eyes: Negative for blurred vision and double vision.  Cardiovascular: Negative for palpitations.  Gastrointestinal: Negative for nausea and vomiting.  Musculoskeletal: Negative for myalgias.  Neurological: Negative for dizziness.    OBJECTIVE  Her  weight is 165 lb 6.4 oz (75 kg). Her oral temperature is 98.1 F (36.7 C). Her blood pressure is 115/68 and her pulse is 97. Her respiration is 16 and oxygen saturation is 98%.  The patient's body mass index is unknown because there is no height or weight on file.  Physical Exam Vitals signs reviewed.  Constitutional:      General: She is not in acute distress.    Appearance: Normal appearance. She is well-developed. She is not ill-appearing or toxic-appearing.  HENT:     Head: Normocephalic  and atraumatic.  Eyes:     Conjunctiva/sclera: Conjunctivae normal.  Neck:     Musculoskeletal: Normal range of motion.  Pulmonary:     Effort: Pulmonary effort is normal.  Abdominal:     Palpations: Abdomen is soft.     Tenderness: There is no abdominal tenderness. There is no right CVA tenderness or left CVA tenderness.  Skin:    General: Skin is warm and dry.  Neurological:     Mental Status: She is alert and oriented to person, place, and time.     Results for orders placed or performed in visit on 11/17/18 (from the past 24 hour(s))  POCT urinalysis dipstick     Status: Abnormal   Collection Time: 11/17/18  1:02 PM  Result Value Ref Range   Color, UA YELLOW    Clarity, UA CLEAR    Glucose, UA Negative Negative   Bilirubin, UA NEGATIVE    Ketones, UA 100 mg/dL    Spec Grav, UA 1.010 1.010 - 1.025   Blood, UA small    pH, UA 6.0 5.0 - 8.0   Protein, UA Negative Negative   Urobilinogen, UA 0.2 0.2 or 1.0 E.U./dL   Nitrite, UA negative    Leukocytes, UA Moderate (2+) (A) Negative   Appearance     Odor    POCT urine pregnancy     Status: Normal   Collection Time: 11/17/18  1:19 PM  Result Value Ref Range   Preg Test, Ur Negative Negative  ASSESSMENT & PLAN  Sommer was seen today for dysuria.  Diagnoses and all orders for this visit:  Leukocytes in urine -     nitrofurantoin, macrocrystal-monohydrate, (MACROBID) 100 MG capsule; Take 1 capsule (100 mg total) by mouth 2 (two) times daily for 5 days.  Dysuria -     POCT urinalysis dipstick  Irregular menses -     POCT urine pregnancy    Hx and UA uggestive of UTI. Will treat empirically at this time. No concern for pyleonephritis-she is afebrile, no N/V/flank pain, no CVA tenderness noted on exam. Discussed w/ pt that we do not obtain urine cx in office. If no improvement in 24-48 hrs will need to f/u with family doctor or urgent care. If any sx worsen/develop new concerning sx, seek care sooner at ED. Pt  voices understanding.    Tenna Delaine, Hershal Coria  [facility] 11/17/2018 1:31 PM

## 2018-11-19 ENCOUNTER — Telehealth: Payer: Self-pay

## 2018-11-19 NOTE — Telephone Encounter (Signed)
Lm on pt vm tcb regarding how pt is doing since her visit with [place].

## 2018-12-09 MED FILL — ADDERALL XR 20 MG CAP SA: 20 | 30 days supply | Qty: 30 | Fill #0

## 2019-01-05 MED FILL — ADDERALL XR 20 MG CAP SA: 20 | 30 days supply | Qty: 30 | Fill #0

## 2019-01-29 MED FILL — ADDERALL XR 20 MG CAP SA: 20 | 30 days supply | Qty: 30 | Fill #0

## 2019-02-16 MED FILL — TRIAMCINOLONE 0.1% OINTMENT: 0.1 | 10 days supply | Qty: 15 | Fill #0

## 2019-02-24 MED FILL — ADDERALL XR 30 MG CAP SA: 30 | 30 days supply | Qty: 30 | Fill #0

## 2019-03-16 MED FILL — ADDERALL XR 20 MG CAP SA: 20 | 30 days supply | Qty: 30 | Fill #0

## 2019-04-12 MED FILL — ADDERALL XR 20 MG CAP SA: 20 | 30 days supply | Qty: 30 | Fill #0

## 2019-05-10 MED FILL — ADDERALL XR 20 MG CAP SA: 20 | 30 days supply | Qty: 30 | Fill #0

## 2019-06-07 MED FILL — ADDERALL XR 20 MG CAP SA: 20 | 30 days supply | Qty: 30 | Fill #0

## 2019-06-29 MED FILL — AMPHETAMINE-DEXTROAMPHETAMI: 20 | 90 days supply | Qty: 90 | Fill #0

## 2019-06-29 MED FILL — buPROPion HCL ER (XL) 150 M: 150 | 90 days supply | Qty: 90 | Fill #0

## 2019-06-29 MED FILL — ESCITALOPRAM 10 MG TABLET: 10 | 90 days supply | Qty: 90 | Fill #0

## 2019-07-03 MED FILL — ADDERALL XR 20 MG CAP SA: 20 | 90 days supply | Qty: 90 | Fill #0

## 2019-08-24 MED FILL — BUPROPION HCL XL 300 MG TAB: 300 | 30 days supply | Qty: 30 | Fill #0

## 2019-09-10 NOTE — L&D Delivery Note (Signed)
Operative Delivery Note At 2:34 AM a viable and healthy female was delivered via Vaginal, Spontaneous.  Presentation: vertex; Position: LOA; Station: +4.  Delivery of the head: Spontaneous  ,   Second maneuver: ,  Mcroberts Third maneuver: ,  Delivery of post shoulder    Verbal consent: obtained from patient.  APGAR: 7, 9; weight pending .   Placenta status: spontaneous, intact.   Cord:  with the following complications: true knot x one.  Cord pH: na  Anesthesia:  epidural Episiotomy: None Lacerations: 2nd degree Suture Repair: 2.0 vicryl Est. Blood Loss (mL):  200  Mom to postpartum.  Baby to Couplet care / Skin to Skin.  Nicole Chase J 04/15/2020, 2:50 AM

## 2019-09-21 LAB — OB RESULTS CONSOLE ANTIBODY SCREEN: Antibody Screen: NEGATIVE

## 2019-09-21 LAB — OB RESULTS CONSOLE ABO/RH: RH Type: POSITIVE

## 2019-09-21 LAB — OB RESULTS CONSOLE HEPATITIS B SURFACE ANTIGEN: Hepatitis B Surface Ag: NEGATIVE

## 2019-09-21 LAB — OB RESULTS CONSOLE GC/CHLAMYDIA
Chlamydia: NEGATIVE
Gonorrhea: NEGATIVE

## 2019-09-21 LAB — OB RESULTS CONSOLE RUBELLA ANTIBODY, IGM: Rubella: IMMUNE

## 2019-09-21 LAB — OB RESULTS CONSOLE RPR: RPR: NONREACTIVE

## 2019-09-21 LAB — OB RESULTS CONSOLE HIV ANTIBODY (ROUTINE TESTING): HIV: NONREACTIVE

## 2019-10-01 MED FILL — BUTALBITAL-APAP-CAFFEINE 50: 50-325-40 | 5 days supply | Qty: 30 | Fill #0

## 2019-10-14 ENCOUNTER — Encounter (HOSPITAL_COMMUNITY): Payer: Self-pay | Admitting: *Deleted

## 2019-10-15 ENCOUNTER — Other Ambulatory Visit (HOSPITAL_COMMUNITY): Payer: Self-pay | Admitting: Obstetrics and Gynecology

## 2019-10-15 DIAGNOSIS — Z8489 Family history of other specified conditions: Secondary | ICD-10-CM

## 2019-10-15 DIAGNOSIS — Z3A12 12 weeks gestation of pregnancy: Secondary | ICD-10-CM

## 2019-10-18 ENCOUNTER — Ambulatory Visit (HOSPITAL_COMMUNITY)
Admission: RE | Admit: 2019-10-18 | Discharge: 2019-10-18 | Disposition: A | Discharge status: Home / Self Care | Payer: No Typology Code available for payment source | Source: Ambulatory Visit | Attending: Obstetrics and Gynecology | Admitting: Obstetrics and Gynecology

## 2019-10-18 ENCOUNTER — Ambulatory Visit (HOSPITAL_COMMUNITY): Payer: No Typology Code available for payment source

## 2019-10-18 ENCOUNTER — Ambulatory Visit (HOSPITAL_COMMUNITY): Payer: No Typology Code available for payment source | Admitting: *Deleted

## 2019-10-18 ENCOUNTER — Ambulatory Visit (HOSPITAL_BASED_OUTPATIENT_CLINIC_OR_DEPARTMENT_OTHER): Payer: No Typology Code available for payment source | Admitting: Genetic Counselor

## 2019-10-18 ENCOUNTER — Other Ambulatory Visit: Payer: Self-pay

## 2019-10-18 ENCOUNTER — Other Ambulatory Visit (HOSPITAL_COMMUNITY): Payer: Self-pay | Admitting: *Deleted

## 2019-10-18 ENCOUNTER — Encounter (HOSPITAL_COMMUNITY): Payer: Self-pay

## 2019-10-18 VITALS — BP 128/77 | HR 75 | Temp 97.7°F

## 2019-10-18 DIAGNOSIS — Z315 Encounter for genetic counseling: Secondary | ICD-10-CM

## 2019-10-18 DIAGNOSIS — O34591 Maternal care for other abnormalities of gravid uterus, first trimester: Secondary | ICD-10-CM | POA: Insufficient documentation

## 2019-10-18 DIAGNOSIS — Q8583 Von Hippel-Lindau syndrome: Secondary | ICD-10-CM

## 2019-10-18 DIAGNOSIS — O34599 Maternal care for other abnormalities of gravid uterus, unspecified trimester: Secondary | ICD-10-CM

## 2019-10-18 DIAGNOSIS — Q858 Other phakomatoses, not elsewhere classified: Secondary | ICD-10-CM | POA: Insufficient documentation

## 2019-10-18 DIAGNOSIS — Z3A12 12 weeks gestation of pregnancy: Secondary | ICD-10-CM

## 2019-10-18 DIAGNOSIS — Z8489 Family history of other specified conditions: Secondary | ICD-10-CM

## 2019-10-18 DIAGNOSIS — Z363 Encounter for antenatal screening for malformations: Secondary | ICD-10-CM | POA: Insufficient documentation

## 2019-10-18 DIAGNOSIS — O26891 Other specified pregnancy related conditions, first trimester: Secondary | ICD-10-CM | POA: Insufficient documentation

## 2019-10-18 DIAGNOSIS — O3402 Maternal care for unspecified congenital malformation of uterus, second trimester: Secondary | ICD-10-CM

## 2019-10-18 DIAGNOSIS — Z8279 Family history of other congenital malformations, deformations and chromosomal abnormalities: Secondary | ICD-10-CM | POA: Diagnosis not present

## 2019-10-18 DIAGNOSIS — Z36 Encounter for antenatal screening for chromosomal anomalies: Secondary | ICD-10-CM | POA: Diagnosis not present

## 2019-10-18 DIAGNOSIS — O0990 Supervision of high risk pregnancy, unspecified, unspecified trimester: Secondary | ICD-10-CM

## 2019-10-18 NOTE — Consult Note (Signed)
Maternal-Fetal Medicine  Name: Nicole Chase MRN: [id number] Requesting Provider: Servando Salina, MD  I had the pleasure of seeing Ms. Muhlbauer today at the [facility]. She is here for ultrasound and consultation. She also met with our genetic counselor today and you will be receiving a separate letter from her.  Patient has a diagnosis of von Hippel-Lindau disease. 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 She reports she had genetic testing that confirmed VHL at sometime in the past. In 2014, the patient was evaluated at the [place], [place] (part of study group) and had CT and MRI.   Other members of her family affected with VHL include: -Her mother had the diagnosis confirmed in her 108s. She had brain hemangioma that was removed at age 54. She is currently stable without any major symptoms.  -Her grandmother had VHL and died of unknown complications. -Her aunt (maternal) died at age 62. She had renal cell carcinoma. -Maternal uncle died at 3 years' age following craniotomy for cerebral hemangioma.  At the [facility], MRI and CT were performed. 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 No solid lesions were seen. Kidneys and brain appeared normal with no lesions. Inner ear structures and retina appeared normal.  Patient only has mild headaches off and on for the last few weeks and takes Tylenol extra strength as needed. She has no symptoms of visual disturbances or abdominal pain.  She does not give history of hypertension or diabetes. She reports having hypothyroidism (euthyroid now) and does not take levothyroxine supplements.  PSH: Wisdom tooth surgery. Medications: Prenatal vitamins, Tylenol. Allergies; Cephalexin ("hives" "fainting") Social: Denies tobacco or drug or alcohol use. She lives with her fianc, who is in good health. Family history: See  above. No history of venous thromboembolism in the family.  Ob history: G2 P0010. 1 early spontaneous miscarriage in Oct 2020. No D&C was performed. Gyn history: No history of abnormal Pap smears or cervical surgeries. No history of breast disease. 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  Ultrasound:  The [place] measurement is consistent with her previously-established dates and good fetal heart activity is seen. The nuchal translucency (NT) measures 1.4 millimeters, which is normal (not taken for risk calculation).  Fetal anatomy that could be ascertained at this gestational age is normal.  No clear evidence of bicornuate uterus, but I cannot rule out definitively.  Our concerns include: von Hippel-Lindau disease (VHL) -In VHL, vascular tumors are seen in multiple organs (brain, spinal cord, retina, kidney, pancreas). It also causes pheochromocytoma in some patients. Mean age of presentation is 26 years. VHL gene is present in chromosome 3p25 and is responsible for production of tumor-suppressor protein.  -Hemangioblastomas can occur in the retina, cerebellum, spinal cord, and brains stem. Other visceral organs including kidneys and pancreas can be affected. Renal cell carcinoma and pheochromocytoma can be present in some patients.  -Surveillance is required from early age to identify the lesions.  -Surgical treatment (removal of hemangiomas in the brain) may be delayed until symptoms are present.  Pregnancy in VHL -Few studies are available on pregnancy outcomes in patients with VHL.  -A retrospective review of 30 pregnancies in 5 women showed good pregnancy outcomes in 70 out of 56 pregnancies. Pheochromocytoma with hypertension in one, increase in symptoms of cerebellar hemangioblastoma in the second and abdominal pain (pancratic cystadenoma) in  the third patient. One patient had 2 stillbirths (microcephaly) and majority had good outcomes (AJOG, vol 180 No. 1).  -In  another study, 9 patients with VHL and hemangioblastomas were followed. No new hemangioblastomas or peritumoral cysts were seen in pregnancy (J Neurosurg 2012 Nov;117(5):818-24).  -In a third study published in 2012, 29 patients and 48 pregnancies (49 newborns) were reviewed (retrospective study).  Maternal VHL complications occurred in 8 women (17%).  VHL lesions were new or increased complaints or requiring intervention. In 2 patients with cerebellar hemangioblastoma  and 2 with pheochromocytomas, life-threatening situations were present. Only one stillbirth occurred. No maternal deaths were seen Sandie Ano et al. Neurology [phone]).  -Overall, I counseled her that we should expect good outcomes. However, intensive surveillance for the presence of tumors and progression is strongly recommended.  -I recommended that she consults with medical oncologist and the patient agreed. We will discuss with genetic counselor (Cancer) and make appropriate recommendations. -I informed her that MRI and CT can be safely performed with minimal side effects.  -Prenatal diagnosis is available and the patient has an option of undergoing chorionic villus sampling or amniocentesis. After meeting with our genetic counselor, she opted not to have prenatal diagnosis. -NIPT was discussed and the patient had her blood drawn today for cell-free fetal DNA screening. We also cautioned her that if active malignancy is present, cell-free fetal DNA may show positive results.  -Vaginal delivery is not contraindicated.  Bicornuate uterus: -I informed the patient that diagnosis of bicornuate uterus cannot be definitively made. However, I recommended that she return in 4 weeks for transvaginal ultrasound to evaluate cervical length (to rule out cervical incompetence).  -Malpresentations are commonly present in patients with bicornuate uterus and preterm delivery can occur.  Recommendations: -Oncology appointment. -Screening for  hemangioblastoma and other lesions. -Follow-up ultrasound to evaluate cervical length in 4 weeks. -Fetal anatomy scan in 6 weeks with cervical length measurement. -Anesthesiologist consultation in the third trimester. -We will communicate cell-free fetal DNA screening results and fax a copy to your office.  Thank you for consultation. Consultation including face-to-face counseling: 45 minutes.

## 2019-10-18 NOTE — Progress Notes (Signed)
ADDENDUM: Patient has appointment with oncologist at [facility] (Dr. Irene Limbo) on 2/25. ------------------------------------------------------------------------------------------------------------------------------------- 10/18/2019  Nicole Chase 1996/06/19 MRN: [id number] DOV: 10/18/2019  Nicole Chase presented to the [facility] for Maternal Fetal Care for a genetics consultation regarding her personal and family history of Von Hippel Lindau syndrome. Nicole Chase came to her appointment alone due to COVID-19 visitor restrictions.   Indication for genetic counseling - Personal and family history of Von Hippel Lindau syndrome  Prenatal history  Nicole Chase is a G58P0010, 24 y.o. female. Her current pregnancy has completed [redacted]w[redacted]d (Estimated Date of Delivery: 05/01/20).  Nicole Chase. Lawwill denied exposure to environmental toxins or chemical agents. She denied the use of alcohol, tobacco or street drugs. She reported taking prenatal vitamins and Miralax. She denied significant viral illnesses, fevers, and bleeding during the course of her pregnancy. Her medical and surgical histories were noncontributory.  Family History  A three generation pedigree was drafted and reviewed. The family history is remarkable for the following:  - Nicole Chase, her mother, her maternal uncle, maternal aunt, and maternal grandmother all have Von Hippel Lindau syndrome. Nicole Chase has a personal history of pancreatic cysts. Her mother has a history of a hemangioblastoma diagnosed in her mid-20s and a renal tumor. Nicole Chase maternal uncle died at age 47 due to complications of a craniotomy performed for his history of a hemangioblastoma. He also had a history of pancreatic cysts. Nicole Chase maternal aunt died at age 71 due to complications from long-term hemodialysis following a bilateral nephrectomy for renal cell carcinoma. Nicole Chase maternal grandmother also had renal cell carcinoma diagnosed at age 90 and  had a history of "oral cancer" per Nicole Chase. See Discussion section for more details.  - Nicole Chase. Rhatigan's partner, Nicole Chase, has a paternal uncle who is "mentally handicapped" and lives in an institution. We discussed that many times, learning disabilities are multifactorial in nature, occurring due to a combination of genetic and environmental factors that are difficult to identify. Learning disabilities can appear to run in families; thus, there is a chance that Nicole Chase's children could also experience learning disabilities of some kind. She understands that she should make the pediatrician aware of any concerns she has about her children's development.  The remaining family histories were reviewed and found to be noncontributory for birth defects, intellectual disability, recurrent pregnancy loss, and known genetic conditions.    The patient's ethnicity is Caucasian. The father of the pregnancy's ethnicity is Caucasian. Ashkenazi Jewish ancestry and consanguinity were denied. Pedigree will be scanned under Media.  Discussion  Nicole Chase. Byard was referred for genetic counseling due to her personal and family history of Von Hippel Lindau syndrome (VHL). Nicole Chase and several of her family members are heterozygous for a partial deletion of the VHL gene, including exon 3 in the 3'-UTR region of the gene. This variant is consistent with their history of VHL.  VHL is a condition characterized by benign and malignant tumors that present in young adulthood. Common tumors include those of the central nervous system (cerebellum, brainstem, spinal cord, retina, and endolymphatic sacs of the middle ears) and tumors of the viscera (pancreas, kidney, adrenal glands, epididymis, and broad ligament). Renal cysts, renal cell carcinoma, pheochromocytomas, pancreatic cysts, and neuroendocrine tumors are all common in individuals with VHL. Symptoms of VHL can manifest as early as age 48, with most symptoms  manifesting by the time an affected individual is in their 13s. There are two forms of familial  VHL. Type 1 VHL is characterized by a history of retinal angiomas, hemangioblastomas, renal cell carcinoma, pancreatic cysts, and neuroendocrine tumors in a family. Type 1 VHL is associated with a low risk for pheochromocytomas, whereas type 2 VHL is associated with a high risk for pheochromocytomas. VHL type 2A is characterized by hemangioblastomas with a low risk for renal cell carcinoma. VHL type 2B is associated with hemangioblastomas, pheochromocytomas, and renal cell carcinoma. VHL type 2C is associated with pheochromocytomas only.  VHL is caused by pathogenic variants in the VHL gene on chromosome 3p25. The VHL gene codes for a tumor suppressor protein. When this protein is dysregulated, it causes increased expression of tumor promoter proteins, leading to tumor development. VHL is inherited in an autosomal dominant fashion. This means thathavingone pathogenic variant in the VHL gene is sufficient to cause symptoms of VHL. Approximately80% of individuals with VHL inherit a gene change from an affected parent. The remaining individuals with VHL are born with de novo (new) mutations in the [facility]. These cases are sporadic and occur in individuals with no family history of VHL. Since Nicole Chase has VHL, she has a 1 in 2 (50%) chance of passing her altered VHL gene variant on with each pregnancy; thus, her children will all have a 1 in 2 (50%) chance of also being affected by VHL.  We discussed the implications of VHL in pregnancy. Nicole Chase was counseled that the majority of pregnant women with VHL have favorable outcomes with high rates of fetal survival (96.4%) and low rates of maternal morbidity (5.4%). Possible concerns in pregnancy include the risk for hemangioblastomas in the central nervous system, which can increase intracranial pressure and cause complications with neuraxial anesthesia, and the  risk for pheochromocytomas, which can mimic the effect of preeclampsia with potentially grave consequences for both the mother and the fetus. We also reviewed that it is unclear if pregnancy induces the growth or progression of tumors; studies show conflicting results in regards to the initiation or progression of hemangioblastomas and renal cell carcinomas during pregnancy.   Regular screening for various VHL-related cancers is recommended, as early detection and management of VHL-related cancers can improve outcomes. Recommended cancer screening for individuals with VHL includes regular physical and ophtholmology exams, assessment of serum/urinary catecholamine levels, MRIs of the brain and spine, and abdominal ultrasounds/CT scans. Surgical management for VHL-related tumors are determined on an individual basis. During pregnancy specifically, intensified surveillance for cerebellar hemangioblastomas and pheochromocytomas may be recommended. For example, a brain MRI may be recommended in the 4th month of pregnancy. It is recommended that pregnant women with a personal history of VHL receive coordinated care from a team of neurologists, ophthalmologists, urologists, radiologists, anesthesiologists, perinatologists, oncologists, and genetics professionals. Nicole Chase reported that she has not had cancer screening performed in the last 5-7 years. We made a plan for me to reach out to colleagues at the [facility] to help coordinate Nicole Chase's oncology care and cancer screening.  Nicole Chase was counseled regarding the option of diagnostic testing via chorionic villus sampling (CVS) or amniocentesis. We discussed the technical aspects of each procedure and quoted up to a 1 in 500 (0.2%) risk for spontaneous pregnancy loss or other adverse pregnancy outcomes as a result of either procedure. Cultured cells from either a placental or amniotic fluid sample allow for the visualization of a fetal karyotype,  which can detect >99% of chromosomal aberrations. Chromosomal microarray can also be performed to identify smaller deletions or duplications of  fetal chromosomal material. CVS or amniocentesis could also be performed to assess whether a baby is affected by VHL. Nicole Chase. Henthorne was also counseled about the possibilities of postnatal diagnosis and initiating VHL screening from infancy as recommended. After careful consideration, Nicole Chase declined diagnostic testing at this time, opting to pursue postnatal diagnosis instead. She understands that diagnostic testing is available at any point through the end of pregnancy and that she may opt to undergo the procedure at a later date should she change her mind.  We also discussed prenatal aneuploidy screening options for chromosomal conditions unrelated to VHL. We reviewed noninvasive prenatal screening (NIPS) as one such option. Specifically, we discussed that NIPS analyzes cell free DNA originating from the placenta that is found in the maternal blood circulation during pregnancy. This test is not diagnostic for chromosome conditions, but can provide information regarding the presence or absence of extra fetal DNA for chromosomes 13, 18, 21, and the sex chromosomes. Thus, it would not identify or rule out all fetal aneuploidy. The reported detection rate is 91-99% for trisomies 21, 18, 13, and sex chromosome aneuploidies. The false positive rate is reported to be less than 0.1% for any of these conditions. However, we discussed that tumor cells can also release cell free DNA into the maternal blood circulation. For this reason, NIPS may detect maternal malignancy, possibly leading to false positive results for fetal aneuploidy.  Nicole Chase. Cunnane was also counseled about first trimester screening as another aneuploidy screening option. Instead of assessing cell free fetal DNA, first trimester screening assess levels of hormones that are made by the body during pregnancy.  First trimester screening can provide risk information for trisomies 72 and 23 in a pregnancy. It cannot provide information regarding the risk for trisomy 42 or sex chromosome aneuploidies in a pregnancy. The reported detection rate is 75-85% for trisomies 21 and 18. The false positive rate is reported to be 5-10% for these conditions.   After careful consideration of her options and the benefits and limitations associated with each, Nicole Chase. Nuffer declined first trimester screening, opting to undergo NIPS instead. We discussed that Nicole Chase. Daisy may not cover NIPS given that she is considered to be low-risk. I offered several options for pursuing NIPS. Firstly, I can perform a benefits investigation to determine Nicole Chase. Maglione's expected out of pocket costs should she wish to pursue NIPS through her insurance. Secondly, NIPS can be pursued through a laboratory who offers a lower-cost patient-pay option. Nicole Chase. Burgener preferred to undergo NIPS through [facility], who offers a patient-pay price of $99.  Per ACOG recommendation, carrier screening for hemoglobinopathies, cystic fibrosis (CF) and spinal muscular atrophy (SMA) was discussed including information about the conditions, rationale for testing, autosomal recessive inheritance, and the option of prenatal diagnosis. I offered carrier screening for these conditions, which Nicole Chase. Bakos declined at this time. Without carrier screening to refine risk and based on ethnicity alone, Nicole Chase. Rathjen risk to be a carrier of CF is 1 in 44. Her risk to be a carrier of SMA is 1 in 53. Her risk to be a carrier of HBB-related hemoglobinopathies is 1 in 60. She was informed that select hemoglobinopathies and CF are included on [place] 's newborn screen, but that SMA currently is not included. We discussed the Early Check research study to add SMA to her baby's newborn screening panel. Nicole Chase. Liggon indicated that she was interested in pursuing this, so she was  given written information on how to enroll in the  Early Check study.   Nicole Chase. Natalie had her blood drawn for [facility] NIPS today. Results will take 5-7 days to be returned. I will call Nicole Chase. Falso when results become available. We will also reach out to our colleagues at the [facility] to help coordinate Nicole Chase. Googe's prenatal care given her diagnosis of VHL.  I counseled Nicole Chase. Tritsch regarding the above risks and available options. The approximate face-to-face time with the genetic counselor was 45 minutes.  In summary:  Reviewed personal and family history concerns  Patient has Von Hippel Lindau syndrome (VHL)  1 in 2 (50%) chance of fetus being affected with each pregnancy  Cancer screening recommended during pregnancy. We will help patient to establish care at the [place] additional screening and testing   Opted to undergo [facility] NIPS. We will follow results  Declined carrier screening for cystic fibrosis, spinal muscular atrophy, and hemoglobinopathies  Declined diagnostic testing  Interested in pursuing VHL testing postnatally/beginning recommended cancer screening for child in infancy   Buelah Manis, [place]

## 2019-10-23 ENCOUNTER — Other Ambulatory Visit (HOSPITAL_COMMUNITY): Payer: Self-pay | Admitting: Genetic Counselor

## 2019-10-25 ENCOUNTER — Encounter (HOSPITAL_COMMUNITY): Payer: Self-pay

## 2019-10-25 ENCOUNTER — Telehealth: Payer: Self-pay | Admitting: Hematology

## 2019-10-25 ENCOUNTER — Other Ambulatory Visit (HOSPITAL_COMMUNITY): Payer: Self-pay

## 2019-10-25 ENCOUNTER — Encounter (HOSPITAL_COMMUNITY): Payer: Self-pay | Admitting: Obstetrics and Gynecology

## 2019-10-25 ENCOUNTER — Other Ambulatory Visit: Payer: Self-pay | Admitting: Obstetrics and Gynecology

## 2019-10-25 DIAGNOSIS — D481 Neoplasm of uncertain behavior of connective and other soft tissue: Secondary | ICD-10-CM

## 2019-10-25 DIAGNOSIS — Q8583 Von Hippel-Lindau syndrome: Secondary | ICD-10-CM

## 2019-10-25 DIAGNOSIS — Q858 Other phakomatoses, not elsewhere classified: Secondary | ICD-10-CM

## 2019-10-25 NOTE — Telephone Encounter (Signed)
Received a new pt referral from Dr. Garwin Brothers for SIGNIFICANT HX-VON HIPPEL-LINDAU SYNDROME. Ms. Runkel has been cld and scheduled to see Dr. Irene Limbo on 2/25 at 10am. Pt aware to arrive 15 minutes early.

## 2019-10-26 ENCOUNTER — Telehealth (HOSPITAL_COMMUNITY): Payer: Self-pay | Admitting: Genetic Counselor

## 2019-10-26 NOTE — Telephone Encounter (Signed)
LVM for Ms. Panzer re: good news about screening results. Requested a call back to my direct line to discuss these in more detail, as no identifiers were provided in voicemail message.   Buelah Manis, MS Genetic Counselor

## 2019-10-27 NOTE — Telephone Encounter (Addendum)
Received a call back from Nicole Chase. Malecki to discuss her negative noninvasive prenatal screening (NIPS) results. Specifically, Nicole Chase. Tafur had NIPS ordered through the laboratory [facility]. These negative results demonstrated an expected representation of chromosome 21, 26, 71, and sex chromosome material, greatly reducing the likelihood of trisomies 24, 35, or 66 and sex chromosome aneuploidies for the pregnancy. Nicole Chase. Mcdaniel requested that I call her friend, Nicole Chase, with the expected sex of the baby. Expected sex is female.  NIPS analyzes placental (fetal) DNA in maternal circulation. NIPS is considered to be highly specific and sensitive, but is not considered to be a diagnostic test. We reviewed that this testing identifies 98-99% of pregnancies with trisomies 73, 44, and 68, as well as sex chromosome abnormalities, but does not test for all genetic conditions. Diagnostic testing via chorionic villus sampling (CVS) or amniocentesis is available should she be interested in confirming this result.   Nicole Chase. First also inquired about an upcoming appointment she has scheduled at the [facility]. Her OBGYN provider referred her for an appointment with Dr. Irene Limbo on 2/25. Nicole Chase. Lundblad had some questions about when specific screening procedures for her history of VHL will be performed, which I informed her Dr. Irene Limbo will likely coordinate. However, we made a plan for me to contact him so that we can facilitate her prenatal and oncology care as a team. Nicole Chase. Lozano was agreeable to this plan and confirmed that she had no further questions at this time.  Nicole Manis, Nicole Chase Genetic Counselor

## 2019-11-02 ENCOUNTER — Ambulatory Visit (HOSPITAL_COMMUNITY): Payer: Self-pay | Admitting: Obstetrics and Gynecology

## 2019-11-04 ENCOUNTER — Other Ambulatory Visit: Payer: Self-pay

## 2019-11-04 ENCOUNTER — Inpatient Hospital Stay: Payer: No Typology Code available for payment source

## 2019-11-04 ENCOUNTER — Inpatient Hospital Stay: Payer: No Typology Code available for payment source | Attending: Hematology | Admitting: Hematology

## 2019-11-04 VITALS — BP 145/85 | HR 98 | Temp 97.8°F | Resp 18 | Ht 68.0 in | Wt 195.7 lb

## 2019-11-04 DIAGNOSIS — G43909 Migraine, unspecified, not intractable, without status migrainosus: Secondary | ICD-10-CM | POA: Insufficient documentation

## 2019-11-04 DIAGNOSIS — Z791 Long term (current) use of non-steroidal anti-inflammatories (NSAID): Secondary | ICD-10-CM | POA: Diagnosis not present

## 2019-11-04 DIAGNOSIS — Z7982 Long term (current) use of aspirin: Secondary | ICD-10-CM | POA: Diagnosis not present

## 2019-11-04 DIAGNOSIS — Q8583 Von Hippel-Lindau syndrome: Secondary | ICD-10-CM

## 2019-11-04 DIAGNOSIS — F329 Major depressive disorder, single episode, unspecified: Secondary | ICD-10-CM | POA: Insufficient documentation

## 2019-11-04 DIAGNOSIS — Q858 Other phakomatoses, not elsewhere classified: Secondary | ICD-10-CM | POA: Diagnosis not present

## 2019-11-04 DIAGNOSIS — Z8279 Family history of other congenital malformations, deformations and chromosomal abnormalities: Secondary | ICD-10-CM | POA: Diagnosis not present

## 2019-11-04 DIAGNOSIS — Z79899 Other long term (current) drug therapy: Secondary | ICD-10-CM | POA: Insufficient documentation

## 2019-11-04 NOTE — Progress Notes (Addendum)
HEMATOLOGY/ONCOLOGY CONSULTATION NOTE  Date of Service: 11/04/2019  Patient Care Team: Patient, No Pcp Per as PCP - General (General Practice)  CHIEF COMPLAINTS/PURPOSE OF CONSULTATION:  Significant Hx of Von Hippel-Lindau syndrome  HISTORY OF PRESENTING ILLNESS:   Nicole Chase is a wonderful 24 y.o. female who has been referred to [place] by Dr Servando Salina for evaluation and management of significant Hx of von Hippel-Lindau syndrome. The pt reports that she is doing well overall.   The pt reports that she was first tested for von Hippel-Lindau syndrome when she was a child. She used to [facility] [place] and was last seen in 2015. She was in a trial there. When she moved the closest place to follow up was at [facility] but she did not follow there. Pt currently sees Adron Bene PA-C at [facility] for her primary care and is also following with Dr. Garwin Brothers as her OBGYN. She is just over [redacted] weeks pregnant. This is the pt's second pregnancy. Pt had a miscarriage in 2019 at 11 weeks. She also has a bicornuate uterus but the OBGYN is not too concerned about issues arising from that at this time.   She saw an Optometrist last year who found some negative vision changes that were not explicitly related to her disease. Her vision changes occurred within the last two years. Her primary issue is seeing objects from a far distance and this vision change is helped with her prescription glasses. Pt has cysts in her pancreas that were found in 2013 on an Abdominal MRI. Pt also had a brain MRI in 2011, which found nothing of concern. She has had her wisdom teeth removed and has an Cefalexin allergy. Pt is no longer on Adderall, which she was on for ADD. She has also stopped taking the medication for her depression since becoming pregnant. She is currently taking prenatal vitamins.    Since becoming pregnant she has been experincing migraines once every couple of days that typically last  most of the day. Prior to pregnancy she was experiencing them 1-2 times per week. Pre-pregnancy pt used Tylenol migraine to help with her pain. She now uses Butaibital - Asprin - Caffine, which helps her fall asleep. Pt does not drink a lot of tea or coffee. She feels that these migraines feel the same since becoming pregnant. Pt has never seen a Neurologist for her headaches.  She had noticed a decrease in her ability to hear speech over the last few years. This is more prominent in the left ear.   Of note prior to the patient's visit today, pt has had MRI Brain ([id number]) completed on 02/11/2010 with results revealing "No sign of hemangioblastoma or other CNS pathology. Left maxillary sinusitis."   Pt has had MRI Abdomen ([id number]) completed on 08/27/2012 with results revealing "1. Three small simple appearing cystic lesions in the tail of the pancreas.  This is an usual finding in a 24 year old individual, and although nonspecific, this may represent a manifestation of von Hippel-Lindau syndrome.  Clinical correlation is recommended. 2.  No other imaging findings to suggest other sequelae of von Hippel-Lindau syndrome at this time (i.e., no evidence of pheochromocytoma, pancreatic malignancy, liver cysts, or renal lesions)."  Most recent lab results (09/21/2019) of CBC w/diff is as follows: all values are WNL.  On review of systems, pt reports migraines, hearing changes, nausea and denies abdominal pain, dysuria, hematuria, bowel habit changes, skin rashes, new vision changes and  any other symptoms.   On PMHx the pt reports Migraines, Von Hippel-Lindau syndrome, ADD, Depression, Miscarriage, Wisdom Tooth Extraction.  MEDICAL HISTORY:  Past Medical History:  Diagnosis Date  . VHL (von Hippel-Lindau syndrome) ([place])     SURGICAL HISTORY: Past Surgical History:  Procedure Laterality Date  . WISDOM TOOTH EXTRACTION      SOCIAL HISTORY: Social History   Socioeconomic History  . Marital  status: Single    Spouse name: Not on file  . Number of children: Not on file  . Years of education: Not on file  . Highest education level: Not on file  Occupational History  . Not on file  Tobacco Use  . Smoking status: Never Smoker  . Smokeless tobacco: Never Used  Substance and Sexual Activity  . Alcohol use: No  . Drug use: No  . Sexual activity: Never    Birth control/protection: None  Other Topics Concern  . Not on file  Social History Narrative  . Not on file   Social Determinants of Health   Financial Resource Strain:   . Difficulty of Paying Living Expenses: Not on file  Food Insecurity:   . Worried About Charity fundraiser in the Last Year: Not on file  . Ran Out of Food in the Last Year: Not on file  Transportation Needs:   . Lack of Transportation (Medical): Not on file  . Lack of Transportation (Non-Medical): Not on file  Physical Activity:   . Days of Exercise per Week: Not on file  . Minutes of Exercise per Session: Not on file  Stress:   . Feeling of Stress : Not on file  Social Connections:   . Frequency of Communication with Friends and Family: Not on file  . Frequency of Social Gatherings with Friends and Family: Not on file  . Attends Religious Services: Not on file  . Active Member of Clubs or Organizations: Not on file  . Attends Archivist Meetings: Not on file  . Marital Status: Not on file  Intimate Partner Violence:   . Fear of Current or Ex-Partner: Not on file  . Emotionally Abused: Not on file  . Physically Abused: Not on file  . Sexually Abused: Not on file    FAMILY HISTORY:   ALLERGIES:  is allergic to cephalexin.  MEDICATIONS:  Current Outpatient Medications  Medication Sig Dispense Refill  . Butalbital-Aspirin-Caffeine (BUTALBITAL-ASA-CAFFEINE PO) Take by mouth.    . Prenatal Vit-Fe Fumarate-FA (PRENATAL VITAMIN PO) Take by mouth.    Marland Kitchen amphetamine-dextroamphetamine (ADDERALL XR) 20 MG 24 hr capsule Take 1 capsule  by mouth daily.  0  . buPROPion (WELLBUTRIN XL) 150 MG 24 hr tablet Take 1 tablet by mouth daily.  2  . cyclobenzaprine (FLEXERIL) 10 MG tablet Take 1 tablet (10 mg total) by mouth 3 (three) times daily as needed for muscle spasms. (Patient not taking: Reported on 11/17/2018) 8 tablet 0  . escitalopram (LEXAPRO) 10 MG tablet Take 1 tablet by mouth daily.  0  . etonogestrel-ethinyl estradiol (NUVARING) 0.12-0.015 MG/24HR vaginal ring Place 1 each vaginally every 28 (twenty-eight) days. Insert vaginally and leave in place for 3 consecutive weeks, then remove for 1 week.    Marland Kitchen ibuprofen (ADVIL,MOTRIN) 800 MG tablet Take 1 tablet (800 mg total) by mouth 3 (three) times daily. (Patient not taking: Reported on 10/18/2019) 21 tablet 0  . lidocaine (LIDODERM) 5 % Place 1 patch onto the skin daily. Remove & Discard patch within 12 hours  or as directed by MD (Patient not taking: Reported on 11/17/2018) 6 patch 0  . oxyCODONE-acetaminophen (PERCOCET/ROXICET) 5-325 MG tablet Take 1-2 tablets by mouth every 4 (four) hours as needed for severe pain. (Patient not taking: Reported on 10/18/2018) 8 tablet 0   No current facility-administered medications for this visit.    REVIEW OF SYSTEMS:    10 Point review of Systems was done is negative except as noted above.  PHYSICAL EXAMINATION: ECOG PERFORMANCE STATUS: 1 - Symptomatic but completely ambulatory  . Vitals:   11/04/19 1106  BP: (!) 145/85  Pulse: 98  Resp: 18  Temp: 97.8 F (36.6 C)  SpO2: 99%   Filed Weights   11/04/19 1106  Weight: 195 lb 11.2 oz (88.8 kg)   .Body mass index is 29.76 kg/m.  GENERAL:alert, in no acute distress and comfortable SKIN: no acute rashes, no significant lesions EYES: conjunctiva are pink and non-injected, sclera anicteric OROPHARYNX: MMM, no exudates, no oropharyngeal erythema or ulceration NECK: supple, no JVD LYMPH:  no palpable lymphadenopathy in the cervical, axillary or inguinal regions LUNGS: clear to  auscultation b/l with normal respiratory effort HEART: regular rate & rhythm ABDOMEN:  normoactive bowel sounds , non tender, not distended. Extremity: no pedal edema PSYCH: alert & oriented x 3 with fluent speech NEURO: no focal motor/sensory deficits  LABORATORY DATA:  I have reviewed the data as listed  .No flowsheet data found.  .No flowsheet data found.   RADIOGRAPHIC STUDIES: I have personally reviewed the radiological images as listed and agreed with the findings in the report. [place] MFM OB Comp Less 14 Wks  Result Date: 10/18/2019 ----------------------------------------------------------------------  OBSTETRICS REPORT                       (Signed Final 10/18/2019 05:27 pm) ---------------------------------------------------------------------- Patient Info  ID #:       [id number]                          D.O.B.:  [birth year] (23 yrs)  Name:       VELENA KEEGAN Shaddix                Visit Date: 10/18/2019 12:26 pm ---------------------------------------------------------------------- Performed By  Performed By:     Jeanene Erb BS,      Ref. Address:     [address]  [place], [place]  Attending:        Tama High MD        [facility]  Referred By:      Alanda Slim                    COUSINS MD ---------------------------------------------------------------------- Orders   #  Description                          Code         Ordered By   1  [place] MFM OB COMP LESS THAN             76801.4      [place]      69 WEEKS                                           COUSINS  ----------------------------------------------------------------------   #  Order #                    Accession #                 Episode #   1  [id number]                   [id number]                  [id number]  ---------------------------------------------------------------------- Indications   [redacted] weeks gestation of pregnancy                Z3A.12   Uterine abnormality during pregnancy           O34.599   (Bicornuate)   Encounter for antenatal screening for          Z36.3   malformations   Genetic carrier (Heterozygous for partial      Z14.8   deletion of the Von Hippel Lindau gene)  ---------------------------------------------------------------------- Vital Signs  Weight (lb): 165                               Height:        5'8"  BMI:  25.09 ---------------------------------------------------------------------- Fetal Evaluation  Num Of Fetuses:         1  Preg. Location:         Intrauterine  Gest. Sac:              Intrauterine  Yolk Sac:               Not visualized  Fetal Pole:             Visualized  Fetal Heart Rate(bpm):  166  Cardiac Activity:       Observed  Amniotic Fluid  AFI FV:      Within normal limits ---------------------------------------------------------------------- Biometry  CRL:      50.4  mm     G. Age:  11w 4d                  EDD:   05/04/20 ---------------------------------------------------------------------- OB History  Gravidity:    2         Term:   0        Prem:   0        SAB:   1  TOP:          0       Ectopic:  0        Living: 0 ---------------------------------------------------------------------- Gestational Age  LMP:           12w 0d        Date:  07/26/19                 EDD:   05/01/20  Best:          12w 0d     Det. By:  LMP  (07/26/19)          EDD:   05/01/20 ---------------------------------------------------------------------- 1st Trimester Genetic Sonogram Screening  Nuc Trans:       1.4  mm  Nasal Bone:                 Present  ---------------------------------------------------------------------- Anatomy  Choroid Plexus:        Visualized             Lower Extremities:      Visualized  Upper Extremities:     Visualized ---------------------------------------------------------------------- Cervix Uterus Adnexa  Cervix  Visualized  Uterus  Bicornuate. ---------------------------------------------------------------------- Impression  On ultrasound, the CRL measurement is consistent with her  previously-established dates and good fetal heart activity is  seen. The nuchal translucency (NT) measures  1.4  millimeters, which is normal (not taken for risk calculation).  Fetal anatomy that could be ascertained at this gestational  age is normal.  No clear evidence of bicornuate uterus, but I cannot rule out  definitively.  xxxxxxxxxxxxxxxxxxxxxxxxxxxxxxxxxxxxxxxxxxxxxxxxxxxx  Consultation Note (Copied from [facility])  Maternal-Fetal Medicine  Name: Felipe Cabell  MRN: [id number]  Requesting Provider: Servando Salina, MD  I had the pleasure of seeing Ms. Northrup today at the [facility]. She is here for ultrasound and  consultation. She also met with our genetic counselor today  and you will be receiving a separate letter from her.  Patient has a diagnosis of von Hippel-Lindau disease. From  her [facility] records, I noted that she is heterozygous for partial  deletion of the VHL gene (includes "exon 3 in the 3'-UTR  gene"). She reports she had genetic testing that confirmed  VHL at sometime in the past. In 2014, the patient was  evaluated at the [place], [place] (  part of study group) and had  CT and MRI.  Other members of her family affected with VHL include:  -Her mother had the diagnosis confirmed in her 74s. She had  brain hemangioma that was removed at age 74. She is  currently stable without any major symptoms.  -Her grandmother had VHL and died of unknown  complications.  -Her aunt (maternal) died at age 78. She had renal cell   carcinoma.  -Maternal uncle died at 72 years' age following craniotomy for  cerebral hemangioma.  At the [facility], MRI and CT were performed. Small pancreatic  cyst (tail of pancreas), measuring 1.2 x 1 cm, was noted. No  solid lesions were seen. Kidneys and brain appeared normal  with no lesions. Inner ear structures and retina appeared  normal.  Patient only has mild headaches off and on for the last few  weeks and takes Tylenol extra strength as needed. She has  no symptoms of visual disturbances or abdominal pain.  She does not give history of hypertension or diabetes. She  reports having hypothyroidism (euthyroid now) and does not  take levothyroxine supplements.  PSH: Wisdom tooth surgery.  Medications: Prenatal vitamins, Tylenol.  Allergies; Cephalexin ("hives" "fainting")  Social: Denies tobacco or drug or alcohol use. She lives with  her fianc, who is in good health.  Family history: See above. No history of venous  thromboembolism in the family.  Ob history: G2 P0010. 1 early spontaneous miscarriage in  Oct 2020. No D&C was performed.  Gyn history: No history of abnormal Pap smears or cervical  surgeries. No history of breast disease. Bicornuate uterus  was suspected on your office ultrasound.  Our concerns include:  von Hippel-Lindau disease (VHL)  -In VHL, vascular tumors are seen in multiple organs (brain,  spinal cord, retina, kidney, pancreas). It also causes  pheochromocytoma in some patients. Mean age of  presentation is 26 years. VHL gene is present in  chromosome 3p25 and is responsible for production of tumor-  suppressor protein.  -Hemangioblastomas can occur in the retina, cerebellum,  spinal cord, and brains stem. Other visceral organs including  kidneys and pancreas can be affected. Renal cell carcinoma  and pheochromocytoma can be present in some patients.  -Surveillance is required from early age to identify the lesions.  -Surgical treatment (removal of hemangiomas in the brain)  may be delayed  until symptoms are present.  Pregnancy in VHL  -Few studies are available on pregnancy outcomes in  patients with VHL.  -A retrospective review of 65 pregnancies in 37 women  showed good pregnancy outcomes in 72 out of 56  pregnancies. Pheochromocytoma with hypertension in one,  increase in symptoms of cerebellar hemangioblastoma in the  second and abdominal pain (pancratic cystadenoma) in the  third patient. One patient had 2 stillbirths (microcephaly) and  majority had good outcomes (AJOG, vol 180 No. 1).  -In another study, 9 patients with VHL and  hemangioblastomas were followed. No new  hemangioblastomas or peritumoral cysts were seen in  pregnancy (J Neurosurg 2012 Nov;117(5):818-24).  -In a third study published in 2012, 29 patients and 48  pregnancies (49 newborns) were reviewed (retrospective  study).  Maternal VHL complications occurred in 8 women  (17%).  VHL lesions were new or increased complaints or  requiring intervention. In 2 patients with cerebellar  hemangioblastoma  and 2 with pheochromocytomas, life-  threatening situations were present. Only one stillbirth  occurred. No maternal deaths were seen Sandie Ano et al.  Neurology [phone]).  -  Overall, I counseled her that we should expect good  outcomes. However, intensive surveillance for the presence  of tumors and progression is strongly recommended.  -I recommended that she consults with medical oncologist  and the patient agreed. We will discuss with genetic  counselor (Cancer) and make appropriate recommendations.  -I informed her that MRI and CT can be safely performed with  minimal side effects.  -Prenatal diagnosis is available and the patient has an option  of undergoing chorionic villus sampling or amniocentesis.  After meeting with our genetic counselor, she opted not to  have prenatal diagnosis.  -NIPT was discussed and the patient had her blood drawn  today for cell-free fetal DNA screening. We also cautioned  her that if active  malignancy is present, cell-free fetal DNA  may show positive results.  -Vaginal delivery is not contraindicated.  Bicornuate uterus:  -I informed the patient that diagnosis of bicornuate uterus  cannot be definitively made. However, I recommended that  she return in 4 weeks for transvaginal ultrasound to evaluate  cervical length (to rule out cervical incompetence).  -Malpresentations are commonly present in patients with  bicornuate uterus and preterm delivery can occur. ---------------------------------------------------------------------- Recommendations  -Oncology appointment.  -Screening for hemangioblastoma and other lesions.  -Follow-up ultrasound to evaluate cervical length in 4 weeks.  -Fetal anatomy scan in 6 weeks with cervical length  measurement.  -Anesthesiologist consultation in the third trimester.  -We will communicate cell-free fetal DNA screening results  and fax a copy to your office. ----------------------------------------------------------------------                  Tama High, MD Electronically Signed Final Report   10/18/2019 05:27 pm ----------------------------------------------------------------------   ASSESSMENT & PLAN:   24 yo female with   1) personal hx of Von Hippel Lindau disease 2) Fhx of Von Hippel Lindau disease PLAN: -Discussed patient's most recent labs from 09/21/2019, all values are WNL. -Discussed 02/11/2010 MRI Brain ([id number]) which revealed "No sign of hemangioblastoma or other CNS pathology. Left maxillary sinusitis."  -Discussed MRI Abdomen ([id number]) which revealed "1. Three small simple appearing cystic lesions in the tail of the pancreas.  This is an usual finding in a 24 year old individual, and although nonspecific, this may represent a manifestation of von Hippel-Lindau syndrome.  Clinical correlation is recommended. 2.  No other imaging findings to suggest other sequelae of von Hippel-Lindau syndrome at this time (i.e., no evidence of  pheochromocytoma, pancreatic malignancy, liver cysts, or renal lesions)." -Advised pt that she has a 1 in 2 chance of passing von Hippel-Lindau syndrome along to her child -Advised pt that the follow up for von Hippel-Lindau syndrome becomes more important in a pt's early 20's  -Advised pt that she will need coordinated care that includes: Neurology, neursurgery, Endocrinology, Retinal specialist, Nephrology, and Optometry -Discussed the trial opportunities, that may ease the burden of cost, at a larger research-based institution  -Discussed the integration and coordination of care available at larger institutions as opposed to her seeing specialist at different centers -Pt would prefer to be referred to a larger institution that can help with coordination of care  -Advised pt that we would prefer to wait to complete an MRI until the middle of gestation for fetal health  -Would recommend pt receive brain MRI and abdomen MRI without IV contrast during the fourth or fifth month of gestation -Advised pt that I would recommend avoiding gadolinium exposure during pregnancy unless absolutely needed and benefits of testing outweigh concerns. -will  need urine hormonal studies for r/o phaeochromocytoma -Recommend pt get sufficient sleep, stay well hydrated, and use prescription lenses to help decrease migraine frequency/intesity  -Will refer pt to [place]: Will refer to [facility] or [facility] to their Von Hippel Lindau disease center for integrated workup and followup for monitoring and treatment   All of the patients questions were answered with apparent satisfaction. The patient knows to call the clinic with any problems, questions or concerns.  I spent 40 mins counseling the patient face to face. The total time spent in the appointment was 60 minutes and more than 50% was on counseling and direct patient cares.    Sullivan Lone MD [place] AAHIVMS [facility] [employer] Hematology/Oncology Physician [facility]  (Office):       [phone] (Work cell):  [phone] (Fax):           [phone]  11/04/2019 2:21 PM  I, Yevette Edwards, am acting as a scribe for Dr. Sullivan Lone.   I have reviewed the above documentation for accuracy and completeness, and I agree with the above. Brunetta Genera MD

## 2019-11-11 ENCOUNTER — Telehealth: Payer: Self-pay | Admitting: *Deleted

## 2019-11-11 NOTE — Telephone Encounter (Signed)
Dr. Irene Limbo requested [facility] [place] be contacted for patient referral for Von Hippel Lindau disease  for integrate tumor evaluation, management and surveillance. Contact  # [phone] - spoke with [place]. [facility] consulted with staff and transferred call to [facility] intake (genitourinary). First transfer stated not to leave a message on that number as staff working remotely and directed call to # [phone]. Reached Voice Mail for [place] with [facility] - left patient information related to referral and call back number for Dr. Irene Limbo nurse desk [phone].

## 2019-11-16 ENCOUNTER — Encounter (HOSPITAL_COMMUNITY): Payer: Self-pay

## 2019-11-16 ENCOUNTER — Ambulatory Visit (HOSPITAL_COMMUNITY)
Admission: RE | Admit: 2019-11-16 | Discharge: 2019-11-16 | Disposition: A | Discharge status: Home / Self Care | Payer: No Typology Code available for payment source | Source: Ambulatory Visit | Attending: Obstetrics and Gynecology | Admitting: Obstetrics and Gynecology

## 2019-11-16 ENCOUNTER — Other Ambulatory Visit: Payer: Self-pay

## 2019-11-16 ENCOUNTER — Ambulatory Visit (HOSPITAL_COMMUNITY): Payer: No Typology Code available for payment source | Admitting: *Deleted

## 2019-11-16 VITALS — BP 128/74 | HR 82 | Temp 98.1°F

## 2019-11-16 DIAGNOSIS — Z3A16 16 weeks gestation of pregnancy: Secondary | ICD-10-CM

## 2019-11-16 DIAGNOSIS — O3402 Maternal care for unspecified congenital malformation of uterus, second trimester: Secondary | ICD-10-CM | POA: Insufficient documentation

## 2019-11-16 DIAGNOSIS — Q513 Bicornate uterus: Secondary | ICD-10-CM | POA: Diagnosis present

## 2019-11-16 DIAGNOSIS — O0990 Supervision of high risk pregnancy, unspecified, unspecified trimester: Secondary | ICD-10-CM | POA: Diagnosis present

## 2019-11-16 DIAGNOSIS — O34592 Maternal care for other abnormalities of gravid uterus, second trimester: Secondary | ICD-10-CM | POA: Diagnosis not present

## 2019-11-24 MED FILL — BUTALBITAL-APAP-CAFFEINE 50: 50-325-40 | 5 days supply | Qty: 30 | Fill #0

## 2019-11-25 ENCOUNTER — Other Ambulatory Visit: Payer: Self-pay | Admitting: Obstetrics and Gynecology

## 2019-11-27 ENCOUNTER — Ambulatory Visit
Admission: RE | Admit: 2019-11-27 | Discharge: 2019-11-27 | Disposition: A | Discharge status: Home / Self Care | Payer: No Typology Code available for payment source | Source: Ambulatory Visit | Attending: Obstetrics and Gynecology | Admitting: Obstetrics and Gynecology

## 2019-11-27 ENCOUNTER — Other Ambulatory Visit: Payer: Self-pay

## 2019-11-27 DIAGNOSIS — Q858 Other phakomatoses, not elsewhere classified: Secondary | ICD-10-CM

## 2019-11-27 DIAGNOSIS — Q8583 Von Hippel-Lindau syndrome: Secondary | ICD-10-CM

## 2019-11-27 DIAGNOSIS — D481 Neoplasm of uncertain behavior of connective and other soft tissue: Secondary | ICD-10-CM

## 2019-11-30 ENCOUNTER — Encounter (HOSPITAL_COMMUNITY): Payer: Self-pay

## 2019-11-30 ENCOUNTER — Ambulatory Visit (HOSPITAL_COMMUNITY)
Admission: RE | Admit: 2019-11-30 | Discharge: 2019-11-30 | Disposition: A | Discharge status: Home / Self Care | Payer: No Typology Code available for payment source | Source: Ambulatory Visit | Attending: Obstetrics and Gynecology | Admitting: Obstetrics and Gynecology

## 2019-11-30 ENCOUNTER — Ambulatory Visit (HOSPITAL_COMMUNITY): Payer: No Typology Code available for payment source | Admitting: *Deleted

## 2019-11-30 ENCOUNTER — Other Ambulatory Visit (HOSPITAL_COMMUNITY): Payer: Self-pay | Admitting: *Deleted

## 2019-11-30 ENCOUNTER — Other Ambulatory Visit: Payer: Self-pay

## 2019-11-30 VITALS — BP 115/69 | HR 86 | Temp 97.6°F

## 2019-11-30 DIAGNOSIS — Z363 Encounter for antenatal screening for malformations: Secondary | ICD-10-CM

## 2019-11-30 DIAGNOSIS — O2692 Pregnancy related conditions, unspecified, second trimester: Secondary | ICD-10-CM | POA: Diagnosis not present

## 2019-11-30 DIAGNOSIS — Q513 Bicornate uterus: Secondary | ICD-10-CM | POA: Diagnosis present

## 2019-11-30 DIAGNOSIS — O0990 Supervision of high risk pregnancy, unspecified, unspecified trimester: Secondary | ICD-10-CM | POA: Diagnosis present

## 2019-11-30 DIAGNOSIS — Z362 Encounter for other antenatal screening follow-up: Secondary | ICD-10-CM

## 2019-11-30 DIAGNOSIS — Z3A18 18 weeks gestation of pregnancy: Secondary | ICD-10-CM

## 2019-11-30 DIAGNOSIS — O3402 Maternal care for unspecified congenital malformation of uterus, second trimester: Secondary | ICD-10-CM | POA: Insufficient documentation

## 2019-11-30 DIAGNOSIS — O321XX0 Maternal care for breech presentation, not applicable or unspecified: Secondary | ICD-10-CM

## 2019-11-30 DIAGNOSIS — Z8279 Family history of other congenital malformations, deformations and chromosomal abnormalities: Secondary | ICD-10-CM

## 2019-12-20 ENCOUNTER — Encounter: Payer: Self-pay | Admitting: Cardiovascular Disease

## 2019-12-24 ENCOUNTER — Other Ambulatory Visit: Payer: No Typology Code available for payment source

## 2019-12-27 ENCOUNTER — Ambulatory Visit (INDEPENDENT_AMBULATORY_CARE_PROVIDER_SITE_OTHER): Payer: No Typology Code available for payment source | Admitting: Cardiovascular Disease

## 2019-12-27 ENCOUNTER — Encounter: Payer: Self-pay | Admitting: Cardiovascular Disease

## 2019-12-27 ENCOUNTER — Other Ambulatory Visit: Payer: Self-pay

## 2019-12-27 ENCOUNTER — Encounter: Payer: Self-pay | Admitting: *Deleted

## 2019-12-27 ENCOUNTER — Other Ambulatory Visit: Payer: Self-pay | Admitting: Family Medicine

## 2019-12-27 VITALS — BP 112/64 | HR 91 | Ht 68.0 in | Wt 213.0 lb

## 2019-12-27 DIAGNOSIS — O132 Gestational [pregnancy-induced] hypertension without significant proteinuria, second trimester: Secondary | ICD-10-CM | POA: Diagnosis not present

## 2019-12-27 DIAGNOSIS — R002 Palpitations: Secondary | ICD-10-CM

## 2019-12-27 DIAGNOSIS — R030 Elevated blood-pressure reading, without diagnosis of hypertension: Secondary | ICD-10-CM | POA: Insufficient documentation

## 2019-12-27 DIAGNOSIS — R0600 Dyspnea, unspecified: Secondary | ICD-10-CM | POA: Diagnosis not present

## 2019-12-27 DIAGNOSIS — R0602 Shortness of breath: Secondary | ICD-10-CM | POA: Diagnosis not present

## 2019-12-27 DIAGNOSIS — R0609 Other forms of dyspnea: Secondary | ICD-10-CM

## 2019-12-27 DIAGNOSIS — R000 Tachycardia, unspecified: Secondary | ICD-10-CM

## 2019-12-27 NOTE — Progress Notes (Signed)
Patient ID: Nicole Chase, female   DOB: 08/26/1996, 24 y.o.   MRN: [id number] Patient enrolled for [employer] to send a 3 day ZIO XT long term holter montor to her home.

## 2019-12-27 NOTE — Progress Notes (Signed)
Cardiology Office Note   Date:  12/29/2019   ID:  Nicole Chase, DOB [birth year], MRN [id number]  PCP:  Patient, No Pcp Per  Cardiologist:   Nicole Latch, MD   No chief complaint on file.    History of Present Illness: Nicole Chase is a 24 y.o. female with Von Hippel-Lindau syndrome, bicornuate uterus, and pancreatic cysts who is being seen today for the evaluation of hypertension and tachycardia at the request of Nicole Salina, MD.  At her 72 and 21-week appointments her blood pressure was in the 140s over 55s.  She was referred to cardiology for further evaluation and management.  Labs at that time revealed normal kidney function.  There was no proteinuria.  She notes that for the last couple weeks she has been feeling more short of breath.  It occurs with exertion but also sometimes at rest.  She has palpitations that last for couple minutes at a time.  She has checked her vitals at the time and the highest has been 156/84 with a heart rate of 128.  The symptoms persist for a few minutes and seem to occur about once per day.  She has increased exertional dyspnea.  She denies lower extremity edema, orthopnea, or PND.  She has approximately 2 caffeinated drinks daily.  She does not get any over-the-counter cold or cough medications.  She does not get much formal exercise.  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  A few years ago she had some issues with palpitations and was for referred to cardiology but never followed up because her symptoms improved.  She thinks at that time it was more related to anxiety.  Past Medical History:  Diagnosis Date  . Exertional dyspnea 12/29/2019  . Gestational hypertension 12/29/2019  . Palpitations 12/29/2019  . VHL (von Hippel-Lindau syndrome) [facility])     Past Surgical History:  Procedure Laterality Date  . WISDOM TOOTH EXTRACTION       Current Outpatient Medications  Medication Sig Dispense Refill  .  Butalbital-Aspirin-Caffeine (BUTALBITAL-ASA-CAFFEINE PO) Take by mouth.    . Prenatal Vit-Fe Fumarate-FA (PRENATAL VITAMIN PO) Take by mouth.    Marland Kitchen amphetamine-dextroamphetamine (ADDERALL XR) 20 MG 24 hr capsule Take 1 capsule by mouth daily.  0  . buPROPion (WELLBUTRIN XL) 150 MG 24 hr tablet Take 1 tablet by mouth daily.  2  . Butalbital-APAP-Caffeine (FIORICET PO) Take by mouth.    . cyclobenzaprine (FLEXERIL) 10 MG tablet Take 1 tablet (10 mg total) by mouth 3 (three) times daily as needed for muscle spasms. (Patient not taking: Reported on 12/27/2019) 8 tablet 0  . escitalopram (LEXAPRO) 10 MG tablet Take 1 tablet by mouth daily.  0  . etonogestrel-ethinyl estradiol (NUVARING) 0.12-0.015 MG/24HR vaginal ring Place 1 each vaginally every 28 (twenty-eight) days. Insert vaginally and leave in place for 3 consecutive weeks, then remove for 1 week.    Marland Kitchen ibuprofen (ADVIL,MOTRIN) 800 MG tablet Take 1 tablet (800 mg total) by mouth 3 (three) times daily. (Patient not taking: Reported on 12/27/2019) 21 tablet 0  . lidocaine (LIDODERM) 5 % Place 1 patch onto the skin daily. Remove & Discard patch within 12 hours or as directed by MD (Patient not taking: Reported on 12/27/2019) 6 patch 0  . oxyCODONE-acetaminophen (PERCOCET/ROXICET) 5-325 MG tablet Take 1-2 tablets by mouth every 4 (four) hours as needed for severe pain. (Patient not taking: Reported on 12/27/2019) 8 tablet 0  No current facility-administered medications for this visit.    Allergies:   Cephalexin    Social History:  The patient  reports that she has never smoked. She has never used smokeless tobacco. She reports that she does not drink alcohol or use drugs.   Family History:  The patient's family history includes Breast cancer in her paternal grandmother; Healthy in her father and mother; Von Hippel-Lindau syndrome in her mother.    ROS:  Please see the history of present illness.   Otherwise, review of systems are positive for none.    All other systems are reviewed and negative.    PHYSICAL EXAM: VS:  BP 112/64   Pulse 91   Ht 5\' 8"  (1.727 m)   Wt 213 lb (96.6 kg)   LMP 07/26/2019   SpO2 99%   BMI 32.39 kg/m  , BMI Body mass index is 32.39 kg/m. GENERAL:  Well appearing HEENT:  Pupils equal round and reactive, fundi not visualized, oral mucosa unremarkable NECK:  No jugular venous distention, waveform within normal limits, carotid upstroke brisk and symmetric, no bruits LUNGS:  Clear to auscultation bilaterally HEART:  RRR.  PMI not displaced or sustained,S1 and S2 within normal limits, no S3, no S4, no clicks, no rubs, no murmurs ABD:  Flat, positive bowel sounds normal in frequency in pitch, no bruits, no rebound, no guarding, no midline pulsatile mass, no hepatomegaly, no splenomegaly.  Gravid uterus EXT:  2 plus pulses throughout, no edema, no cyanosis no clubbing SKIN:  No rashes no nodules NEURO:  Cranial nerves II through XII grossly intact, motor grossly intact throughout PSYCH:  Cognitively intact, oriented to person place and time   EKG:  EKG is ordered today. The ekg ordered today demonstrates sinus rhythm.  Rate 91 bpm.   Recent Labs: 12/27/2019: TSH 0.693    Lipid Panel No results found for: CHOL, TRIG, HDL, CHOLHDL, VLDL, LDLCALC, LDLDIRECT    Wt Readings from Last 3 Encounters:  12/27/19 213 lb (96.6 kg)  11/04/19 195 lb 11.2 oz (88.8 kg)  11/17/18 165 lb 6.4 oz (75 kg)      ASSESSMENT AND PLAN:  # Gestational hypertension:  Ms. Nicole Chase denies any history of hypertension prior to her pregnancy.  At 19 or 21 weeks she was right on the cusp of being considered chronic hypertension versus gestational hypertension.  She did not have any proteinuria.  Today her blood pressure is perfect.  We will not start any antihypertensives at this time.  She will continue to monitor and limit her sodium intake.  I did recommend that she work on walking for exercise as tolerated.  We would not initiate  antihypertensives unless her blood pressures over 160/110.  #Exertional dyspnea. This may be related to her pregnancy.  However we will get an echocardiogram to ensure there is no underlying structural issue.  # Palpitations: Labs were mostly unremarkable.  Thyroid has not been checked.  We will check a TSH and free T4 today.  Get a 3-day ZIO monitor. Current medicines are reviewed at length with the patient today.  The patient does not have concerns regarding medicines.  The following changes have been made:  no change  Labs/ tests ordered today include:   Orders Placed This Encounter  Procedures  . T4, free  . TSH  . LONG TERM MONITOR (3-14 DAYS)  . EKG 12-Lead  . ECHOCARDIOGRAM COMPLETE     Disposition:   FU with Lekha Dancer C. Oval Linsey, MD, [facility] in  1 month     Signed, Lyndsy Gilberto C. Oval Linsey, MD, [facility]  12/29/2019 12:24 PM    [place]

## 2019-12-27 NOTE — Patient Instructions (Addendum)
Medication Instructions:  Your physician recommends that you continue on your current medications as directed. Please refer to the Current Medication list given to you today.  *If you need a refill on your cardiac medications before your next appointment, please call your pharmacy*  Lab Work: TSH/FT4 TODAY   If you have labs (blood work) drawn today and your tests are completely normal, you will receive your results only by: Marland Kitchen MyChart Message (if you have MyChart) OR . A paper copy in the mail If you have any lab test that is abnormal or we need to change your treatment, we will call you to review the results.  Testing/Procedures: Your physician has requested that you have an echocardiogram. Echocardiography is a painless test that uses sound waves to create images of your heart. It provides your doctor with information about the size and shape of your heart and how well your heart's chambers and valves are working. This procedure takes approximately one hour. There are no restrictions for this procedure. [facility] [address]  ZIO MONITOR FOR 3 DAYS  Follow-Up: At [facility], you and your health needs are our priority.  As part of our continuing mission to provide you with exceptional heart care, we have created designated Provider Care Teams.  These Care Teams include your primary Cardiologist (physician) and Advanced Practice Providers (APPs -  Physician Assistants and Nurse Practitioners) who all work together to provide you with the care you need, when you need it.  We recommend signing up for the patient portal called "MyChart".  Sign up information is provided on this After Visit Summary.  MyChart is used to connect with patients for Virtual Visits (Telemedicine).  Patients are able to view lab/test results, encounter notes, upcoming appointments, etc.  Non-urgent messages can be sent to your provider as well.   To learn more about what you can do with MyChart,  go to [email or web address].    Your next appointment:   4 week(s)  The format for your next appointment:   In Person/VIRTUAL   Provider:   You may see DR [facility]  or one of the following Advanced Practice Providers on your designated Care Team:    Kerin Ransom, PA-C  [place], [place]  Coletta Memos, [place]  Other Instructions:  LIMIT SODIUM/SALT INTAKE   MONITOR AND LOG YOUR BLOOD PRESSURE AT HOME, HAVE AVAILABLE AT FOLLOW UP VISIT   [place] Monitor Instructions   Your physician has requested you wear your ZIO patch monitor___3____days.   This is a single patch monitor.  [employer] supplies one patch monitor per enrollment.  Additional stickers are not available.   Please do not apply patch if you will be having a Nuclear Stress Test, Echocardiogram, Cardiac CT, MRI, or Chest Xray during the time frame you would be wearing the monitor. The patch cannot be worn during these tests.  You cannot remove and re-apply the ZIO XT patch monitor.   Your ZIO patch monitor will be sent USPS Priority mail from [facility] directly to your home address. The monitor may also be mailed to a PO BOX if home delivery is not available.   It may take 3-5 days to receive your monitor after you have been enrolled.   Once you have received you monitor, please review enclosed instructions.  Your monitor has already been registered assigning a specific monitor serial # to you.   Applying the monitor   Shave hair from upper  left chest.   Hold abrader disc by orange tab.  Rub abrader in 40 strokes over left upper chest as indicated in your monitor instructions.   Clean area with 4 enclosed alcohol pads .  Use all pads to assure are is cleaned thoroughly.  Let dry.   Apply patch as indicated in monitor instructions.  Patch will be place under collarbone on left side of chest with arrow pointing upward.   Rub patch adhesive wings for 2 minutes.Remove white label marked "1".   Remove white label marked "2".  Rub patch adhesive wings for 2 additional minutes.   While looking in a mirror, press and release button in center of patch.  A small green light will flash 3-4 times .  This will be your only indicator the monitor has been turned on.     Do not shower for the first 24 hours.  You may shower after the first 24 hours.   Press button if you feel a symptom. You will hear a small click.  Record Date, Time and Symptom in the Patient Log Book.   When you are ready to remove patch, follow instructions on last 2 pages of Patient Log Book.  Stick patch monitor onto last page of Patient Log Book.   Place Patient Log Book in [place] box.  Use locking tab on box and tape box closed securely.  The [employer] and [employer] has [employer] on it.  Please place in mailbox as soon as possible.  Your physician should have your test results approximately 7 days after the monitor has been mailed back to [facility].   Call [place] at [phone] if you have questions regarding your ZIO XT patch monitor.  Call them immediately if you see an orange light blinking on your monitor.   If your monitor falls off in less than 4 days contact our Monitor department at [phone].  If your monitor becomes loose or falls off after 4 days call [employer] at [phone] for suggestions on securing your monitor.    Echocardiogram An echocardiogram is a procedure that uses painless sound waves (ultrasound) to produce an image of the heart. Images from an echocardiogram can provide important information about:  Signs of coronary artery disease (CAD).  Aneurysm detection. An aneurysm is a weak or damaged part of an artery wall that bulges out from the normal force of blood pumping through the body.  Heart size and shape. Changes in the size or shape of the heart can be associated with certain conditions, including heart failure, aneurysm, and CAD.  Heart muscle  function.  Heart valve function.  Signs of a past heart attack.  Fluid buildup around the heart.  Thickening of the heart muscle.  A tumor or infectious growth around the heart valves. Tell a health care provider about:  Any allergies you have.  All medicines you are taking, including vitamins, herbs, eye drops, creams, and over-the-counter medicines.  Any blood disorders you have.  Any surgeries you have had.  Any medical conditions you have.  Whether you are pregnant or may be pregnant. What are the risks? Generally, this is a safe procedure. However, problems may occur, including:  Allergic reaction to dye (contrast) that may be used during the procedure. What happens before the procedure? No specific preparation is needed. You may eat and drink normally. What happens during the procedure?   An IV tube may be inserted into one of your veins.  You may receive  contrast through this tube. A contrast is an injection that improves the quality of the pictures from your heart.  A gel will be applied to your chest.  A wand-like tool (transducer) will be moved over your chest. The gel will help to transmit the sound waves from the transducer.  The sound waves will harmlessly bounce off of your heart to allow the heart images to be captured in real-time motion. The images will be recorded on a computer. The procedure may vary among health care providers and hospitals. What happens after the procedure?  You may return to your normal, everyday life, including diet, activities, and medicines, unless your health care provider tells you not to do that. Summary  An echocardiogram is a procedure that uses painless sound waves (ultrasound) to produce an image of the heart.  Images from an echocardiogram can provide important information about the size and shape of your heart, heart muscle function, heart valve function, and fluid buildup around your heart.  You do not need to do  anything to prepare before this procedure. You may eat and drink normally.  After the echocardiogram is completed, you may return to your normal, everyday life, unless your health care provider tells you not to do that. This information is not intended to replace advice given to you by your health care provider. Make sure you discuss any questions you have with your health care provider. Document Revised: 12/17/2018 Document Reviewed: 09/28/2016 Elsevier Patient Education  [place].

## 2019-12-27 NOTE — Progress Notes (Signed)
Patient is [redacted] weeks pregnant and is having tachycardia and hypertension. [facility], Hoyle Barr, Cousins, MD, referred her urgently to [facility] and she has an appt to see Skeet Latch at 2:00 today. They are unable to do the referral per her insurance and they have called [place] to do it as her PCP.

## 2019-12-28 LAB — T4, FREE: Free T4: 0.9 ng/dL (ref 0.82–1.77)

## 2019-12-28 LAB — TSH: TSH: 0.693 u[IU]/mL (ref 0.450–4.500)

## 2019-12-29 ENCOUNTER — Encounter: Payer: Self-pay | Admitting: Cardiovascular Disease

## 2019-12-29 DIAGNOSIS — R0609 Other forms of dyspnea: Secondary | ICD-10-CM

## 2019-12-29 DIAGNOSIS — O139 Gestational [pregnancy-induced] hypertension without significant proteinuria, unspecified trimester: Secondary | ICD-10-CM

## 2019-12-29 DIAGNOSIS — R0600 Dyspnea, unspecified: Secondary | ICD-10-CM

## 2019-12-29 DIAGNOSIS — R002 Palpitations: Secondary | ICD-10-CM

## 2019-12-29 HISTORY — DX: Other forms of dyspnea: R06.09

## 2019-12-29 HISTORY — DX: Gestational (pregnancy-induced) hypertension without significant proteinuria, unspecified trimester: O13.9

## 2019-12-29 HISTORY — DX: Dyspnea, unspecified: R06.00

## 2019-12-29 HISTORY — DX: Palpitations: R00.2

## 2019-12-30 ENCOUNTER — Other Ambulatory Visit: Payer: Self-pay

## 2019-12-30 ENCOUNTER — Ambulatory Visit (HOSPITAL_COMMUNITY)
Admission: RE | Admit: 2019-12-30 | Discharge: 2019-12-30 | Disposition: A | Discharge status: Home / Self Care | Payer: No Typology Code available for payment source | Source: Ambulatory Visit | Attending: Obstetrics and Gynecology | Admitting: Obstetrics and Gynecology

## 2019-12-30 ENCOUNTER — Encounter (HOSPITAL_COMMUNITY): Payer: Self-pay

## 2019-12-30 ENCOUNTER — Ambulatory Visit (HOSPITAL_COMMUNITY): Payer: No Typology Code available for payment source | Admitting: *Deleted

## 2019-12-30 VITALS — BP 135/71 | HR 93 | Temp 97.4°F

## 2019-12-30 DIAGNOSIS — Q513 Bicornate uterus: Secondary | ICD-10-CM | POA: Insufficient documentation

## 2019-12-30 DIAGNOSIS — Z3A22 22 weeks gestation of pregnancy: Secondary | ICD-10-CM

## 2019-12-30 DIAGNOSIS — O3402 Maternal care for unspecified congenital malformation of uterus, second trimester: Secondary | ICD-10-CM

## 2019-12-30 DIAGNOSIS — Z362 Encounter for other antenatal screening follow-up: Secondary | ICD-10-CM | POA: Diagnosis present

## 2019-12-31 ENCOUNTER — Ambulatory Visit (INDEPENDENT_AMBULATORY_CARE_PROVIDER_SITE_OTHER): Payer: No Typology Code available for payment source

## 2019-12-31 ENCOUNTER — Other Ambulatory Visit (HOSPITAL_COMMUNITY): Payer: Self-pay | Admitting: *Deleted

## 2019-12-31 DIAGNOSIS — Q513 Bicornate uterus: Secondary | ICD-10-CM

## 2019-12-31 DIAGNOSIS — R0602 Shortness of breath: Secondary | ICD-10-CM

## 2019-12-31 DIAGNOSIS — R002 Palpitations: Secondary | ICD-10-CM

## 2019-12-31 DIAGNOSIS — O3402 Maternal care for unspecified congenital malformation of uterus, second trimester: Secondary | ICD-10-CM

## 2020-01-12 ENCOUNTER — Ambulatory Visit (HOSPITAL_COMMUNITY): Payer: No Typology Code available for payment source | Attending: Cardiology

## 2020-01-12 ENCOUNTER — Other Ambulatory Visit: Payer: Self-pay

## 2020-01-12 DIAGNOSIS — R002 Palpitations: Secondary | ICD-10-CM | POA: Diagnosis not present

## 2020-01-12 DIAGNOSIS — R0602 Shortness of breath: Secondary | ICD-10-CM | POA: Diagnosis not present

## 2020-01-12 NOTE — Progress Notes (Signed)
Unable to administer Definity due to pregnancy.

## 2020-01-26 ENCOUNTER — Encounter: Payer: Self-pay | Admitting: Cardiovascular Disease

## 2020-01-26 ENCOUNTER — Telehealth (INDEPENDENT_AMBULATORY_CARE_PROVIDER_SITE_OTHER): Payer: No Typology Code available for payment source | Admitting: Cardiovascular Disease

## 2020-01-26 VITALS — Ht 68.0 in | Wt 218.0 lb

## 2020-01-26 DIAGNOSIS — R002 Palpitations: Secondary | ICD-10-CM

## 2020-01-26 DIAGNOSIS — O132 Gestational [pregnancy-induced] hypertension without significant proteinuria, second trimester: Secondary | ICD-10-CM | POA: Diagnosis not present

## 2020-01-26 DIAGNOSIS — R0600 Dyspnea, unspecified: Secondary | ICD-10-CM | POA: Diagnosis not present

## 2020-01-26 DIAGNOSIS — R0609 Other forms of dyspnea: Secondary | ICD-10-CM

## 2020-01-26 NOTE — Patient Instructions (Addendum)
Medication Instructions:  Your physician recommends that you continue on your current medications as directed. Please refer to the Current Medication list given to you today.  *If you need a refill on your cardiac medications before your next appointment, please call your pharmacy*  Lab Work: NONE  Testing/Procedures: NONE  Follow-Up: At [facility], you and your health needs are our priority.  As part of our continuing mission to provide you with exceptional heart care, we have created designated Provider Care Teams.  These Care Teams include your primary Cardiologist (physician) and Advanced Practice Providers (APPs -  Physician Assistants and Nurse Practitioners) who all work together to provide you with the care you need, when you need it.  We recommend signing up for the patient portal called "MyChart".  Sign up information is provided on this After Visit Summary.  MyChart is used to connect with patients for Virtual Visits (Telemedicine).  Patients are able to view lab/test results, encounter notes, upcoming appointments, etc.  Non-urgent messages can be sent to your provider as well.   To learn more about what you can do with MyChart, go to [email or web address].    Your next appointment:   6 month(s)  The format for your next appointment:   In Person  Provider:   You may see Skeet Latch, MD or one of the following Advanced Practice Providers on your designated Care Team:    Kerin Ransom, PA-C  [place], [place]  Coletta Memos, FNP   CONTINUE TO [place], CALL THE OFFICE IF IT STARTS TO GO UP

## 2020-01-26 NOTE — Progress Notes (Signed)
Virtual Visit via Telephone Note   This visit type was conducted due to national recommendations for restrictions regarding the COVID-19 Pandemic (e.g. social distancing) in an effort to limit this patient's exposure and mitigate transmission in our community.  Due to her co-morbid illnesses, this patient is at least at moderate risk for complications without adequate follow up.  This format is felt to be most appropriate for this patient at this time.  The patient did not have access to video technology/had technical difficulties with video requiring transitioning to audio format only (telephone).  All issues noted in this document were discussed and addressed.  No physical exam could be performed with this format.  Please refer to the patient's chart for her  consent to telehealth for [facility].   The patient was identified using 2 identifiers.  Date:  01/26/2020   ID:  Nicole Chase, DOB 06/18/1996, MRN [id number]  Patient Location: Home Provider Location: Office  PCP:  [facility]  Cardiologist:  Skeet Latch, MD  Electrophysiologist:  None   Evaluation Performed:  Follow-Up Visit  Chief Complaint:  Gestational hypertension  History of Present Illness:    The patient does not have symptoms concerning for COVID-19 infection (fever, chills, cough, or new shortness of breath).    History of Present Illness: Nicole Chase is a 24 y.o. female at [redacted] weeks gestation with Von Hippel-Lindau syndrome, bicornuate uterus, and pancreatic cysts here for follow up.  She was initially seen 12/2019 for the evaluation of hypertension and tachycardia At her 19 and 21-week appointments her blood pressure was in the 140s over 70s.  She was referred to cardiology for further evaluation and management.  Labs at that time revealed normal kidney function.  There was no proteinuria.  At her appointment she reported exertional dyspnea and palpitations.  She was referred for an echo 01/2020  that revealed LVEF 65 to 70% and was otherwise unremarkable.  She also had a 3-day ZIO monitor that noted occasional PACs and PVCs.  She reported periods of lightheadedness, shortness of breath, and palpitations, at which time sinus rhythm was noted.  She is continue to check her blood pressure since that appointment and notes that it has been better controlled.  Mostly it is been under 140 over the 80s.  She continues to follow-up closely with OB and has not had any proteinuria.  Overall she is feeling well.  She does have some shortness of breath when laying down and some swelling that is limited mostly to her face and hands.     Past Medical History:  Diagnosis Date  . Exertional dyspnea 12/29/2019  . Gestational hypertension 12/29/2019  . Palpitations 12/29/2019  . VHL (von Hippel-Lindau syndrome) [facility])     Past Surgical History:  Procedure Laterality Date  . WISDOM TOOTH EXTRACTION       Current Outpatient Medications  Medication Sig Dispense Refill  . aspirin EC 81 MG tablet Take 81 mg by mouth daily.    . Butalbital-APAP-Caffeine (FIORICET PO) Take by mouth as needed.     . Prenatal Vit-Fe Fumarate-FA (PRENATAL VITAMIN PO) Take by mouth.     No current facility-administered medications for this visit.    Allergies:   Cephalexin    Social History:  The patient  reports that she has never smoked. She has never used smokeless tobacco. She reports that she does not drink alcohol or use drugs.   Family History:  The patient's family history includes Breast cancer  in her paternal grandmother; Healthy in her father and mother; Von Hippel-Lindau syndrome in her mother.    ROS:  Please see the history of present illness.   Otherwise, review of systems are positive for none.   All other systems are reviewed and negative.    PHYSICAL EXAM: VS:  Ht 5\' 8"  (1.727 m)   Wt 218 lb (98.9 kg)   LMP 07/26/2019   BMI 33.15 kg/m  , BMI Body mass index is 33.15 kg/m. GENERAL:  Well  appearing HEENT:  Pupils equal round and reactive, fundi not visualized, oral mucosa unremarkable NECK:  No jugular venous distention, waveform within normal limits, carotid upstroke brisk and symmetric, no bruits LUNGS:  Clear to auscultation bilaterally HEART:  RRR.  PMI not displaced or sustained,S1 and S2 within normal limits, no S3, no S4, no clicks, no rubs, no murmurs ABD:  Flat, positive bowel sounds normal in frequency in pitch, no bruits, no rebound, no guarding, no midline pulsatile mass, no hepatomegaly, no splenomegaly.  Gravid uterus EXT:  2 plus pulses throughout, no edema, no cyanosis no clubbing SKIN:  No rashes no nodules NEURO:  Cranial nerves II through XII grossly intact, motor grossly intact throughout PSYCH:  Cognitively intact, oriented to person place and time   EKG:  EKG is ordered today. The ekg ordered today demonstrates sinus rhythm.  Rate 91 bpm.  Echo 01/12/20:  IMPRESSIONS    1. Left ventricular ejection fraction, by estimation, is 65 to 70%. The  left ventricle has normal function. The left ventricle has no regional  wall motion abnormalities. Left ventricular diastolic parameters were  normal.  2. Right ventricular systolic function is normal. The right ventricular  size is normal. There is normal pulmonary artery systolic pressure.  3. The mitral valve is normal in structure. No evidence of mitral valve  regurgitation. No evidence of mitral stenosis.  4. The aortic valve is tricuspid. Aortic valve regurgitation is not  visualized. No aortic stenosis is present.   3 Day Zio Monitor 01/2020:  Quality: Fair.  Baseline artifact. Predominant rhythm: sinus rhythm Average heart rate: 93 bpm Max heart rate: 177 bpm Min heart rate: 58 bpm  Occasional PVCs and PACs Patient reported dizziness, chest pain and shortness of breath at which time sinus rhythm was noted.   Recent Labs: 12/27/2019: TSH 0.693    Lipid Panel No results found for: CHOL,  TRIG, HDL, CHOLHDL, VLDL, LDLCALC, LDLDIRECT    Wt Readings from Last 3 Encounters:  01/26/20 218 lb (98.9 kg)  12/27/19 213 lb (96.6 kg)  11/04/19 195 lb 11.2 oz (88.8 kg)      ASSESSMENT AND PLAN:  # Gestational hypertension:  Ms. Dewing denies any history of hypertension prior to her pregnancy.  At 19 or 21 weeks she was right on the cusp of being considered chronic hypertension versus gestational hypertension.  She is currently unable to check her blood pressure because she is on vacation at the beach.  She has been checking it regularly and has been less than 140/90.  She is feeling well.  No plans to initiate medication unless greater than 160/110.  She follows up every 2 to 3 weeks with her OB.  She has not had any proteinuria.  We will want to follow-up with her postpartum to ensure that her blood pressure is returning to normal.  She will also need close follow-up with her primary care physician, as her likelihood of developing essential hypertension down the line is higher  than the baseline population.  If she does run into elevated blood pressures again prior to delivery we will be happy to see her and assist with management of her hypertension.  # Exertional dyspnea. Pregnancy related.  Echocardiogram was normal 01/2020.  # Palpitations:  Rare PACs and PVCs on monitor but did not correlate with symptoms.  No intervention required.  The following changes have been made:  no change  Labs/ tests ordered today include:   No orders of the defined types were placed in this encounter.    COVID-19 Education: The signs and symptoms of COVID-19 were discussed with the patient and how to seek care for testing (follow up with PCP or arrange E-visit).  The importance of social distancing was discussed today.  Time:   Today, I have spent 17 minutes with the patient with telehealth technology discussing the above problems.     Disposition:   FU with Nicole Chase C. Oval Linsey, MD, [facility] in 6  months     Signed, Hedi Barkan C. Oval Linsey, MD, [facility]  01/26/2020 9:14 AM    [place]

## 2020-01-31 ENCOUNTER — Ambulatory Visit (HOSPITAL_COMMUNITY): Payer: No Typology Code available for payment source | Attending: Obstetrics and Gynecology

## 2020-01-31 ENCOUNTER — Ambulatory Visit: Payer: No Typology Code available for payment source

## 2020-02-14 ENCOUNTER — Other Ambulatory Visit: Payer: Self-pay | Admitting: Obstetrics and Gynecology

## 2020-03-18 ENCOUNTER — Encounter (HOSPITAL_COMMUNITY): Payer: Self-pay | Admitting: Obstetrics and Gynecology

## 2020-03-18 ENCOUNTER — Inpatient Hospital Stay (HOSPITAL_COMMUNITY)
Admission: AD | Admit: 2020-03-18 | Discharge: 2020-03-19 | Disposition: A | Discharge status: Home / Self Care | Payer: No Typology Code available for payment source | Attending: Obstetrics and Gynecology | Admitting: Obstetrics and Gynecology

## 2020-03-18 ENCOUNTER — Other Ambulatory Visit: Payer: Self-pay

## 2020-03-18 DIAGNOSIS — Z7982 Long term (current) use of aspirin: Secondary | ICD-10-CM | POA: Diagnosis not present

## 2020-03-18 DIAGNOSIS — O133 Gestational [pregnancy-induced] hypertension without significant proteinuria, third trimester: Secondary | ICD-10-CM | POA: Diagnosis not present

## 2020-03-18 DIAGNOSIS — Q858 Other phakomatoses, not elsewhere classified: Secondary | ICD-10-CM | POA: Insufficient documentation

## 2020-03-18 DIAGNOSIS — O1403 Mild to moderate pre-eclampsia, third trimester: Secondary | ICD-10-CM | POA: Insufficient documentation

## 2020-03-18 DIAGNOSIS — Z881 Allergy status to other antibiotic agents status: Secondary | ICD-10-CM | POA: Diagnosis not present

## 2020-03-18 DIAGNOSIS — Z3A33 33 weeks gestation of pregnancy: Secondary | ICD-10-CM | POA: Insufficient documentation

## 2020-03-18 DIAGNOSIS — Q8583 Von Hippel-Lindau syndrome: Secondary | ICD-10-CM

## 2020-03-18 DIAGNOSIS — G43009 Migraine without aura, not intractable, without status migrainosus: Secondary | ICD-10-CM

## 2020-03-18 LAB — CBC
HCT: 38.2 % (ref 36.0–46.0)
Hemoglobin: 12.7 g/dL (ref 12.0–15.0)
MCH: 31.7 pg (ref 26.0–34.0)
MCHC: 33.2 g/dL (ref 30.0–36.0)
MCV: 95.3 fL (ref 80.0–100.0)
Platelets: 219 10*3/uL (ref 150–400)
RBC: 4.01 MIL/uL (ref 3.87–5.11)
RDW: 12.8 % (ref 11.5–15.5)
WBC: 10.6 10*3/uL — ABNORMAL HIGH (ref 4.0–10.5)
nRBC: 0 % (ref 0.0–0.2)

## 2020-03-18 NOTE — MAU Note (Signed)
Pt reports she has had a headache for a few days has taken Fioricet that has dulled it but has not gone away. Noticed increase in swelling in face and took b/p at home 163/97. Had lab work done this week in office and was told her creatinine was elevated.

## 2020-03-19 LAB — COMPREHENSIVE METABOLIC PANEL
ALT: 35 U/L (ref 0–44)
AST: 26 U/L (ref 15–41)
Albumin: 2.8 g/dL — ABNORMAL LOW (ref 3.5–5.0)
Alkaline Phosphatase: 74 U/L (ref 38–126)
Anion gap: 11 (ref 5–15)
BUN: 10 mg/dL (ref 6–20)
CO2: 21 mmol/L — ABNORMAL LOW (ref 22–32)
Calcium: 10.1 mg/dL (ref 8.9–10.3)
Chloride: 103 mmol/L (ref 98–111)
Creatinine, Ser: 0.72 mg/dL (ref 0.44–1.00)
GFR calc Af Amer: >60 mL/min (ref >60)
GFR calc non Af Amer: >60 mL/min (ref >60)
Glucose, Bld: 126 mg/dL — ABNORMAL HIGH (ref 70–99)
Potassium: 3.7 mmol/L (ref 3.5–5.1)
Sodium: 135 mmol/L (ref 135–145)
Total Bilirubin: 0.4 mg/dL (ref 0.3–1.2)
Total Protein: 6.4 g/dL — ABNORMAL LOW (ref 6.5–8.1)

## 2020-03-19 LAB — PROTEIN / CREATININE RATIO, URINE
Creatinine, Urine: 100.31 mg/dL
Protein Creatinine Ratio: 0.52 mg/mg{Cre} — ABNORMAL HIGH (ref 0.00–0.15)
Total Protein, Urine: 52 mg/dL

## 2020-03-19 LAB — URIC ACID: Uric Acid, Serum: 4.3 mg/dL (ref 2.5–7.1)

## 2020-03-19 LAB — LACTATE DEHYDROGENASE: LDH: 119 U/L (ref 98–192)

## 2020-03-19 NOTE — Discharge Instructions (Signed)

## 2020-03-19 NOTE — MAU Provider Note (Signed)
History     Chief Complaint  Patient presents with  . Hypertension  24 yo G1P0 Female with  VHL syndrome @ 33 6/[redacted] week gestation presents with c/o facial swelling, and headache not as responsive to use of fioricet. Pt took her BP at home and it was elevated. Pregnancy has been complicated by gest HTN with recent elevation of PCRas well as mild elev AST. Pt has been on rest. Denies visual change or blurry vision. She usually has headache at least 3 x/week. Pt had MRI of brain earlier in pregnancy due to VHL and headache. Currently h/a has resolved. She had taken the fioricet at 4 pm   OB History    Gravida  2   Para      Term      Preterm      AB  1   Living  0     SAB  1   TAB      Ectopic      Multiple      Live Births              Past Medical History:  Diagnosis Date  . Exertional dyspnea 12/29/2019  . Gestational hypertension 12/29/2019  . Palpitations 12/29/2019  . VHL (von Hippel-Lindau syndrome) [facility])     Past Surgical History:  Procedure Laterality Date  . WISDOM TOOTH EXTRACTION      Family History  Problem Relation Age of Onset  . Healthy Mother   . Von Hippel-Lindau syndrome Mother   . Healthy Father   . Breast cancer Paternal Grandmother     Social History   Tobacco Use  . Smoking status: Never Smoker  . Smokeless tobacco: Never Used  Vaping Use  . Vaping Use: Never used  Substance Use Topics  . Alcohol use: No  . Drug use: No    Allergies:  Allergies  Allergen Reactions  . Cephalexin Hives    Medications Prior to Admission  Medication Sig Dispense Refill Last Dose  . aspirin EC 81 MG tablet Take 81 mg by mouth daily.     . Butalbital-APAP-Caffeine (FIORICET PO) Take by mouth as needed.      . Prenatal Vit-Fe Fumarate-FA (PRENATAL VITAMIN PO) Take by mouth.        Physical Exam   Blood pressure 121/68, pulse 95, temperature 98.9 F (37.2 C), resp. rate 18, last menstrual period 07/26/2019. Patient Vitals for the past 24  hrs:  BP Temp Pulse Resp  03/19/20 0100 121/68 -- 95 --  03/19/20 0045 (!) 141/78 -- (!) 106 --  03/19/20 0030 138/82 -- (!) 114 --  03/19/20 0015 125/88 -- 99 --  03/19/20 0000 131/75 -- (!) 115 --  03/18/20 2345 135/80 -- (!) 104 --  03/18/20 2330 137/73 -- (!) 104 --  03/18/20 2315 (!) 141/76 -- (!) 104 --  03/18/20 2312 (!) 142/81 -- (!) 121 --  03/18/20 2249 (!) 145/79 98.9 F (37.2 C) 100 18     General appearance: alert, cooperative and no distress Lungs: clear to auscultation bilaterally Heart: regular rate and rhythm, S1, S2 normal, no murmur, click, rub or gallop Abdomen: gravid soft Extremities: no edema, redness or tenderness in the calves or thighs and DTR 2+ no clonus   Tracing: baseline 135 (+) accel 160  ED Course    IMP: headache r/o worsening preeclampsia vs continuation of her usual h/a IUP @ 33 6/7 Preeclampsia( mild) P) PIH labs. NST MDM Marvene Staff, MD  03/19/2020  Addendum: CBC    Component Value Date/Time   WBC 10.6 (H) 03/18/2020 2327   RBC 4.01 03/18/2020 2327   HGB 12.7 03/18/2020 2327   HCT 38.2 03/18/2020 2327   PLT 219 03/18/2020 2327   MCV 95.3 03/18/2020 2327   MCH 31.7 03/18/2020 2327   MCHC 33.2 03/18/2020 2327   RDW 12.8 03/18/2020 2327   CMP Latest Ref Rng & Units 03/18/2020  Glucose 70 - 99 mg/dL 126(H)  BUN 6 - 20 mg/dL 10  Creatinine 0.44 - 1.00 mg/dL 0.72  Sodium 135 - 145 mmol/L 135  Potassium 3.5 - 5.1 mmol/L 3.7  Chloride 98 - 111 mmol/L 103  CO2 22 - 32 mmol/L 21(L)  Calcium 8.9 - 10.3 mg/dL 10.1  Total Protein 6.5 - 8.1 g/dL 6.4(L)  Total Bilirubin 0.3 - 1.2 mg/dL 0.4  Alkaline Phos 38 - 126 U/L 74  AST 15 - 41 U/L 26  ALT 0 - 44 U/L 35   PCR 0.52  IMP: h/a resolved. While PCR is elevated, BP and labs shows no severe range D/c home  Preeclampsia warning signs Keep sched OB appt

## 2020-03-21 MED FILL — BUTALB-ACETAMIN-CAFF 50-325: 50-325-40 | 5 days supply | Qty: 30 | Fill #0

## 2020-04-03 ENCOUNTER — Telehealth (HOSPITAL_COMMUNITY): Payer: Self-pay | Admitting: *Deleted

## 2020-04-03 NOTE — Telephone Encounter (Signed)
Preadmission screen  

## 2020-04-04 ENCOUNTER — Encounter (HOSPITAL_COMMUNITY): Payer: Self-pay | Admitting: *Deleted

## 2020-04-06 MED FILL — PROMETHAZINE 25 MG TABLET: 25 | 20 days supply | Qty: 20 | Fill #0

## 2020-04-12 ENCOUNTER — Other Ambulatory Visit: Payer: Self-pay | Admitting: Obstetrics and Gynecology

## 2020-04-12 ENCOUNTER — Other Ambulatory Visit (HOSPITAL_COMMUNITY)
Admission: RE | Admit: 2020-04-12 | Discharge: 2020-04-12 | Disposition: A | Discharge status: Home / Self Care | Payer: No Typology Code available for payment source | Source: Ambulatory Visit | Attending: Obstetrics and Gynecology | Admitting: Obstetrics and Gynecology

## 2020-04-12 DIAGNOSIS — Z20822 Contact with and (suspected) exposure to covid-19: Secondary | ICD-10-CM | POA: Insufficient documentation

## 2020-04-12 DIAGNOSIS — Z01812 Encounter for preprocedural laboratory examination: Secondary | ICD-10-CM | POA: Insufficient documentation

## 2020-04-12 LAB — SARS CORONAVIRUS 2 (TAT 6-24 HRS): SARS Coronavirus 2: NEGATIVE

## 2020-04-14 ENCOUNTER — Inpatient Hospital Stay (HOSPITAL_COMMUNITY)
Admission: AD | Admit: 2020-04-14 | Discharge: 2020-04-17 | DRG: 807 | Disposition: A | Discharge status: Home / Self Care | Payer: No Typology Code available for payment source | Attending: Obstetrics and Gynecology | Admitting: Obstetrics and Gynecology

## 2020-04-14 ENCOUNTER — Inpatient Hospital Stay (HOSPITAL_COMMUNITY): Payer: No Typology Code available for payment source | Admitting: Anesthesiology

## 2020-04-14 ENCOUNTER — Inpatient Hospital Stay (HOSPITAL_COMMUNITY): Payer: No Typology Code available for payment source

## 2020-04-14 ENCOUNTER — Encounter (HOSPITAL_COMMUNITY): Payer: Self-pay | Admitting: Obstetrics and Gynecology

## 2020-04-14 ENCOUNTER — Other Ambulatory Visit: Payer: Self-pay

## 2020-04-14 DIAGNOSIS — O99344 Other mental disorders complicating childbirth: Secondary | ICD-10-CM | POA: Diagnosis present

## 2020-04-14 DIAGNOSIS — Z3A37 37 weeks gestation of pregnancy: Secondary | ICD-10-CM

## 2020-04-14 DIAGNOSIS — F419 Anxiety disorder, unspecified: Secondary | ICD-10-CM | POA: Diagnosis present

## 2020-04-14 DIAGNOSIS — O134 Gestational [pregnancy-induced] hypertension without significant proteinuria, complicating childbirth: Principal | ICD-10-CM | POA: Diagnosis present

## 2020-04-14 DIAGNOSIS — Z20822 Contact with and (suspected) exposure to covid-19: Secondary | ICD-10-CM | POA: Diagnosis present

## 2020-04-14 DIAGNOSIS — Z3490 Encounter for supervision of normal pregnancy, unspecified, unspecified trimester: Secondary | ICD-10-CM | POA: Diagnosis present

## 2020-04-14 LAB — COMPREHENSIVE METABOLIC PANEL
ALT: 32 U/L (ref 0–44)
AST: 28 U/L (ref 15–41)
Albumin: 2.7 g/dL — ABNORMAL LOW (ref 3.5–5.0)
Alkaline Phosphatase: 95 U/L (ref 38–126)
Anion gap: 10 (ref 5–15)
BUN: 8 mg/dL (ref 6–20)
CO2: 21 mmol/L — ABNORMAL LOW (ref 22–32)
Calcium: 10.2 mg/dL (ref 8.9–10.3)
Chloride: 103 mmol/L (ref 98–111)
Creatinine, Ser: 0.74 mg/dL (ref 0.44–1.00)
GFR calc Af Amer: >60 mL/min (ref >60)
GFR calc non Af Amer: >60 mL/min (ref >60)
Glucose, Bld: 99 mg/dL (ref 70–99)
Potassium: 4 mmol/L (ref 3.5–5.1)
Sodium: 134 mmol/L — ABNORMAL LOW (ref 135–145)
Total Bilirubin: 0.5 mg/dL (ref 0.3–1.2)
Total Protein: 6.6 g/dL (ref 6.5–8.1)

## 2020-04-14 LAB — CBC
HCT: 36.3 % (ref 36.0–46.0)
HCT: 36.8 % (ref 36.0–46.0)
Hemoglobin: 12.1 g/dL (ref 12.0–15.0)
Hemoglobin: 12.2 g/dL (ref 12.0–15.0)
MCH: 31.3 pg (ref 26.0–34.0)
MCH: 31.2 pg (ref 26.0–34.0)
MCHC: 33.3 g/dL (ref 30.0–36.0)
MCHC: 33.2 g/dL (ref 30.0–36.0)
MCV: 93.8 fL (ref 80.0–100.0)
MCV: 94.1 fL (ref 80.0–100.0)
Platelets: 231 10*3/uL (ref 150–400)
Platelets: 212 10*3/uL (ref 150–400)
RBC: 3.87 MIL/uL (ref 3.87–5.11)
RBC: 3.91 MIL/uL (ref 3.87–5.11)
RDW: 13.2 % (ref 11.5–15.5)
RDW: 13.2 % (ref 11.5–15.5)
WBC: 9.3 10*3/uL (ref 4.0–10.5)
WBC: 10.7 10*3/uL — ABNORMAL HIGH (ref 4.0–10.5)
nRBC: 0 % (ref 0.0–0.2)
nRBC: 0 % (ref 0.0–0.2)

## 2020-04-14 LAB — PROTEIN / CREATININE RATIO, URINE
Creatinine, Urine: 45.75 mg/dL
Protein Creatinine Ratio: 0.57 mg/mg{Cre} — ABNORMAL HIGH (ref 0.00–0.15)
Total Protein, Urine: 26 mg/dL

## 2020-04-14 LAB — TYPE AND SCREEN
ABO/RH(D): A POS
Antibody Screen: NEGATIVE

## 2020-04-14 LAB — RPR: RPR Ser Ql: NONREACTIVE

## 2020-04-14 LAB — ABO/RH: ABO/RH(D): A POS

## 2020-04-14 MED ORDER — OXYTOCIN-SODIUM CHLORIDE 30-0.9 UT/500ML-% IV SOLN
1.0000 m[IU]/min | INTRAVENOUS | Status: DC
  Administered 2020-04-14: 2 m[IU]/min via INTRAVENOUS
  Administered 2020-04-14: 10 m[IU]/min via INTRAVENOUS
  Filled 2020-04-14: qty 500

## 2020-04-14 MED ORDER — LACTATED RINGERS IV SOLN: INTRAVENOUS | Status: DC

## 2020-04-14 MED ORDER — ONDANSETRON HCL 4 MG/2ML IJ SOLN
4.0000 mg | Freq: Four times a day (QID) | INTRAMUSCULAR | Status: DC | PRN
  Administered 2020-04-14 – 2020-04-15 (×2): 4 mg via INTRAVENOUS
  Filled 2020-04-14 (×3): qty 2

## 2020-04-14 MED ORDER — ACETAMINOPHEN 325 MG PO TABS: 650.0000 mg | ORAL_TABLET | ORAL | Status: DC | PRN

## 2020-04-14 MED ORDER — EPHEDRINE 5 MG/ML INJ: 10.0000 mg | INTRAVENOUS | Status: DC | PRN

## 2020-04-14 MED ORDER — OXYTOCIN BOLUS FROM INFUSION
333.0000 mL | Freq: Once | INTRAVENOUS | Status: AC
  Administered 2020-04-15: 333 mL via INTRAVENOUS

## 2020-04-14 MED ORDER — MISOPROSTOL 25 MCG QUARTER TABLET
25.0000 ug | ORAL_TABLET | ORAL | Status: DC | PRN
  Administered 2020-04-14 (×2): 25 ug via VAGINAL
  Filled 2020-04-14 (×2): qty 1

## 2020-04-14 MED ORDER — FENTANYL-BUPIVACAINE-NACL 0.5-0.125-0.9 MG/250ML-% EP SOLN
12.0000 mL/h | EPIDURAL | Status: DC | PRN
  Filled 2020-04-14: qty 250

## 2020-04-14 MED ORDER — LACTATED RINGERS IV SOLN: 500.0000 mL | INTRAVENOUS | Status: DC | PRN

## 2020-04-14 MED ORDER — DIPHENHYDRAMINE HCL 50 MG/ML IJ SOLN: 12.5000 mg | INTRAMUSCULAR | Status: DC | PRN

## 2020-04-14 MED ORDER — BUTALBITAL-APAP-CAFFEINE 50-325-40 MG PO TABS
1.0000 | ORAL_TABLET | ORAL | Status: DC | PRN
  Administered 2020-04-14: 1 via ORAL
  Filled 2020-04-14: qty 1

## 2020-04-14 MED ORDER — PHENYLEPHRINE 40 MCG/ML (10ML) SYRINGE FOR IV PUSH (FOR BLOOD PRESSURE SUPPORT): 80.0000 ug | PREFILLED_SYRINGE | INTRAVENOUS | Status: DC | PRN

## 2020-04-14 MED ORDER — LIDOCAINE HCL (PF) 1 % IJ SOLN
30.0000 mL | INTRAMUSCULAR | Status: AC | PRN
  Administered 2020-04-15: 30 mL via SUBCUTANEOUS
  Filled 2020-04-14: qty 30

## 2020-04-14 MED ORDER — OXYTOCIN-SODIUM CHLORIDE 30-0.9 UT/500ML-% IV SOLN
2.5000 [IU]/h | INTRAVENOUS | Status: DC
  Filled 2020-04-14: qty 500

## 2020-04-14 MED ORDER — LIDOCAINE HCL (PF) 1 % IJ SOLN
INTRAMUSCULAR | Status: DC | PRN
  Administered 2020-04-14: 5 mL via EPIDURAL
  Administered 2020-04-14: 4 mL via EPIDURAL

## 2020-04-14 MED ORDER — LACTATED RINGERS IV SOLN
500.0000 mL | Freq: Once | INTRAVENOUS | Status: AC
  Administered 2020-04-14: 500 mL via INTRAVENOUS

## 2020-04-14 MED ORDER — SOD CITRATE-CITRIC ACID 500-334 MG/5ML PO SOLN: 30.0000 mL | ORAL | Status: DC | PRN

## 2020-04-14 MED ORDER — TERBUTALINE SULFATE 1 MG/ML IJ SOLN: 0.2500 mg | Freq: Once | INTRAMUSCULAR | Status: DC | PRN

## 2020-04-14 MED ORDER — PHENYLEPHRINE 40 MCG/ML (10ML) SYRINGE FOR IV PUSH (FOR BLOOD PRESSURE SUPPORT)
80.0000 ug | PREFILLED_SYRINGE | INTRAVENOUS | Status: DC | PRN
  Filled 2020-04-14: qty 10

## 2020-04-14 MED ORDER — SODIUM CHLORIDE (PF) 0.9 % IJ SOLN
INTRAMUSCULAR | Status: DC | PRN
  Administered 2020-04-14: 12 mL/h via EPIDURAL

## 2020-04-14 MED ORDER — PROMETHAZINE HCL 25 MG/ML IJ SOLN: 12.5000 mg | Freq: Four times a day (QID) | INTRAMUSCULAR | Status: DC | PRN

## 2020-04-14 NOTE — Progress Notes (Signed)
Nicole Chase is a 24 y.o. G2P0010 at [redacted]w[redacted]d by LMP admitted for induction of labor due to Hypertension.  Subjective:   Objective: BP 124/65   Pulse 90   Temp 99 F (37.2 C) (Oral)   Resp 16   Ht 5\' 8"  (1.727 m)   Wt 109.9 kg   LMP 07/26/2019   BMI 36.83 kg/m  No intake/output data recorded. No intake/output data recorded.  FHT:  FHR: 155 bpm, variability: moderate,  accelerations:  Present,  decelerations:  Absent UC:   irregular, every 1-5 minutes SVE:   Dilation: 2 Effacement (%): 50 Station: -3 Exam by:: Dr. Ronita Hipps  Labs: Lab Results  Component Value Date   WBC 9.3 04/14/2020   HGB 12.1 04/14/2020   HCT 36.3 04/14/2020   MCV 93.8 04/14/2020   PLT 231 04/14/2020   CMP     Component Value Date/Time   NA 134 (L) 04/14/2020 0117   K 4.0 04/14/2020 0117   CL 103 04/14/2020 0117   CO2 21 (L) 04/14/2020 0117   GLUCOSE 99 04/14/2020 0117   BUN 8 04/14/2020 0117   CREATININE 0.74 04/14/2020 0117   CALCIUM 10.2 04/14/2020 0117   PROT 6.6 04/14/2020 0117   ALBUMIN 2.7 (L) 04/14/2020 0117   AST 28 04/14/2020 0117   ALT 32 04/14/2020 0117   ALKPHOS 95 04/14/2020 0117   BILITOT 0.5 04/14/2020 0117   GFRNONAA >60 04/14/2020 0117   GFRAA >60 04/14/2020 0117     Assessment / Plan: IOL for Gest HTN  VHL syndrome  Labor: Progressing normally Preeclampsia:  no signs or symptoms of toxicity, intake and ouput balanced and labs stable Fetal Wellbeing:  Category I Pain Control:  Labor support without medications I/D:  n/a Anticipated MOD:  NSVD  Nicole Chase J 04/14/2020, 8:22 AM

## 2020-04-14 NOTE — Anesthesia Procedure Notes (Signed)
Epidural Patient location during procedure: OB Start time: 04/14/2020 11:33 AM End time: 04/14/2020 11:41 AM  Staffing Anesthesiologist: Josephine Igo, MD Performed: anesthesiologist   Preanesthetic Checklist Completed: patient identified, IV checked, site marked, risks and benefits discussed, surgical consent, monitors and equipment checked, pre-op evaluation and timeout performed  Epidural Patient position: sitting Prep: DuraPrep and site prepped and draped Patient monitoring: continuous pulse ox and blood pressure Approach: midline Location: L4-L5 Injection technique: LOR air  Needle:  Needle type: Tuohy  Needle gauge: 17 G Needle length: 9 cm and 9 Needle insertion depth: 7 cm Catheter type: closed end flexible Catheter size: 19 Gauge Catheter at skin depth: 12 cm Test dose: negative and Other  Assessment Events: blood not aspirated (.mfc), injection not painful, no injection resistance, no paresthesia and negative IV test  Additional Notes Patient identified. Risks and benefits discussed including failed block, incomplete  Pain control, post dural puncture headache, nerve damage, paralysis, blood pressure Changes, nausea, vomiting, reactions to medications-both toxic and allergic and post Partum back pain. All questions were answered. Patient expressed understanding and wished to proceed. Sterile technique was used throughout procedure. Epidural site was Dressed with sterile barrier dressing. No paresthesias, signs of intravascular injection Or signs of intrathecal spread were encountered.  Patient was more comfortable after the epidural was dosed. Please see RN's note for documentation of vital signs and FHR which are stable. Reason for block:procedure for pain

## 2020-04-14 NOTE — Progress Notes (Signed)
S: Doing well, pain controlled with epidural. Family at the bedside providing support. R/B/A of IUPC placement discussed. Pt consents to procedure.   O: Vitals:   04/14/20 2101 04/14/20 2130 04/14/20 2200 04/14/20 2231  BP: 126/62 134/76 131/71 128/77  Pulse: 83 90 92 (!) 104  Resp: 17 15  17   Temp:      TempSrc:      SpO2:      Weight:      Height:       FHT:  FHR: 130 bpm, variability: moderate,  accelerations:  Present,  decelerations:  Absent UC:   regular, every 2-3 minutes SVE:   Dilation: 6 Effacement (%): 100 Station: -1 Exam by:: Derrell Lolling CNM IUPC placed without difficulty  A / P: Induction of labor due to gestational hypertension,  progressing well on pitocin Fetal Wellbeing:  Category I Pain Control:  Epidural Anticipated MOD:  Guarded. Will continue to increase Pitocin until adequate MVUs. Suspect CPD.    Dr. Ronita Hipps aware of plan of care.   Suzan Nailer, CNM, MSN 04/14/2020, 11:17 PM

## 2020-04-14 NOTE — Anesthesia Preprocedure Evaluation (Addendum)
Anesthesia Evaluation  Patient identified by MRN, date of birth, ID band Patient awake    Reviewed: Allergy & Precautions, Patient's Chart, lab work & pertinent test results  Airway Mallampati: II  TM Distance: >3 FB Neck ROM: Full    Dental no notable dental hx. 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 Negative MRI and CT of head earlier in pregnancy negative psych ROS   GI/Hepatic negative GI ROS, Neg liver ROS,   Endo/Other  Von Hippel Lindau syndrome Obesity Small pancreatic cysts in tail of pancreas earlier in pregnancy  Renal/GU negative Renal ROS  negative genitourinary   Musculoskeletal negative musculoskeletal ROS (+)   Abdominal (+) + obese,   Peds  Hematology negative hematology ROS (+)   Anesthesia Other Findings   Reproductive/Obstetrics (+) Pregnancy                            Anesthesia Physical Anesthesia Plan  ASA: III  Anesthesia Plan: Epidural   Post-op Pain Management:    Induction:   PONV Risk Score and Plan:   Airway Management Planned: Natural Airway  Additional Equipment:   Intra-op Plan:   Post-operative Plan:   Informed Consent: I have reviewed the patients History and Physical, chart, labs and discussed the procedure including the risks, benefits and alternatives for the proposed anesthesia with the patient or authorized representative who has indicated his/her understanding and acceptance.       Plan Discussed with: Anesthesiologist  Anesthesia Plan Comments:         Anesthesia Quick Evaluation

## 2020-04-14 NOTE — Plan of Care (Signed)
Problem: Education: Goal: Knowledge of General Education information will improve Description: Including pain rating scale, medication(s)/side effects and non-pharmacologic comfort measures 04/14/2020 0109 by Nicole Baltimore, RN Outcome: Progressing 04/14/2020 0109 by Nicole Baltimore, RN Outcome: Progressing   Problem: Health Behavior/Discharge Planning: Goal: Ability to manage health-related needs will improve 04/14/2020 0109 by Nicole Baltimore, RN Outcome: Progressing 04/14/2020 0109 by Nicole Baltimore, RN Outcome: Progressing   Problem: Clinical Measurements: Goal: Ability to maintain clinical measurements within normal limits will improve 04/14/2020 0109 by Nicole Baltimore, RN Outcome: Progressing 04/14/2020 0109 by Nicole Baltimore, RN Outcome: Progressing Goal: Will remain free from infection 04/14/2020 0109 by Nicole Baltimore, RN Outcome: Progressing 04/14/2020 0109 by Nicole Baltimore, RN Outcome: Progressing Goal: Diagnostic test results will improve 04/14/2020 0109 by Nicole Baltimore, RN Outcome: Progressing 04/14/2020 0109 by Nicole Baltimore, RN Outcome: Progressing Goal: Respiratory complications will improve 04/14/2020 0109 by Nicole Baltimore, RN Outcome: Progressing 04/14/2020 0109 by Nicole Baltimore, RN Outcome: Progressing Goal: Cardiovascular complication will be avoided 04/14/2020 0109 by Nicole Baltimore, RN Outcome: Progressing 04/14/2020 0109 by Nicole Baltimore, RN Outcome: Progressing   Problem: Clinical Measurements: Goal: Diagnostic test results will improve 04/14/2020 0109 by Nicole Baltimore, RN Outcome: Progressing 04/14/2020 0109 by Nicole Baltimore, RN Outcome: Progressing   Problem: Activity: Goal: Risk for activity intolerance will decrease 04/14/2020 0109 by Nicole Baltimore, RN Outcome: Progressing 04/14/2020 0109 by Nicole Baltimore, RN Outcome: Progressing   Problem:  Nutrition: Goal: Adequate nutrition will be maintained 04/14/2020 0109 by Nicole Baltimore, RN Outcome: Progressing 04/14/2020 0109 by Nicole Baltimore, RN Outcome: Progressing   Problem: Coping: Goal: Level of anxiety will decrease 04/14/2020 0109 by Nicole Baltimore, RN Outcome: Progressing 04/14/2020 0109 by Nicole Baltimore, RN Outcome: Progressing   Problem: Elimination: Goal: Will not experience complications related to bowel motility 04/14/2020 0109 by Nicole Baltimore, RN Outcome: Progressing 04/14/2020 0109 by Nicole Baltimore, RN Outcome: Progressing Goal: Will not experience complications related to urinary retention 04/14/2020 0109 by Nicole Baltimore, RN Outcome: Progressing 04/14/2020 0109 by Nicole Baltimore, RN Outcome: Progressing   Problem: Pain Managment: Goal: General experience of comfort will improve 04/14/2020 0109 by Nicole Baltimore, RN Outcome: Progressing 04/14/2020 0109 by Nicole Baltimore, RN Outcome: Progressing   Problem: Safety: Goal: Ability to remain free from injury will improve 04/14/2020 0109 by Nicole Baltimore, RN Outcome: Progressing 04/14/2020 0109 by Nicole Baltimore, RN Outcome: Progressing   Problem: Education: Goal: Knowledge of Childbirth will improve 04/14/2020 0109 by Nicole Baltimore, RN Outcome: Progressing 04/14/2020 0109 by Nicole Baltimore, RN Outcome: Progressing Goal: Ability to make informed decisions regarding treatment and plan of care will improve 04/14/2020 0109 by Nicole Baltimore, RN Outcome: Progressing 04/14/2020 0109 by Nicole Baltimore, RN Outcome: Progressing Goal: Ability to state and carry out methods to decrease the pain will improve 04/14/2020 0109 by Nicole Baltimore, RN Outcome: Progressing 04/14/2020 0109 by Nicole Baltimore, RN Outcome: Progressing Goal: Individualized Educational Video(s) 04/14/2020 0109 by Nicole Baltimore, RN Outcome:  Progressing 04/14/2020 0109 by Nicole Baltimore, RN Outcome: Progressing   Problem: Skin Integrity: Goal: Risk for impaired skin integrity will decrease 04/14/2020 0109 by Nicole Baltimore, RN Outcome: Progressing 04/14/2020 0109 by Nicole Baltimore, RN Outcome: Progressing   Problem: Coping: Goal: Ability to verbalize concerns and feelings about labor and delivery will improve 04/14/2020 0109 by  Nicole Baltimore, RN Outcome: Progressing 04/14/2020 0109 by Nicole Baltimore, RN Outcome: Progressing   Problem: Life Cycle: Goal: Ability to make normal progression through stages of labor will improve 04/14/2020 0109 by Nicole Baltimore, RN Outcome: Progressing 04/14/2020 0109 by Nicole Baltimore, RN Outcome: Progressing Goal: Ability to effectively push during vaginal delivery will improve 04/14/2020 0109 by Nicole Baltimore, RN Outcome: Progressing 04/14/2020 0109 by Nicole Baltimore, RN Outcome: Progressing   Problem: Role Relationship: Goal: Will demonstrate positive interactions with the child 04/14/2020 0109 by Nicole Baltimore, RN Outcome: Progressing 04/14/2020 0109 by Nicole Baltimore, RN Outcome: Progressing   Problem: Safety: Goal: Risk of complications during labor and delivery will decrease 04/14/2020 0109 by Nicole Baltimore, RN Outcome: Progressing 04/14/2020 0109 by Nicole Baltimore, RN Outcome: Progressing   Problem: Pain Management: Goal: Relief or control of pain from uterine contractions will improve 04/14/2020 0109 by Nicole Baltimore, RN Outcome: Progressing 04/14/2020 0109 by Nicole Baltimore, RN Outcome: Progressing   Problem: Education: Goal: Knowledge of Childbirth will improve Outcome: Progressing Goal: Ability to make informed decisions regarding treatment and plan of care will improve Outcome: Progressing Goal: Ability to state and carry out methods to decrease the pain will improve Outcome:  Progressing Goal: Individualized Educational Video(s) Outcome: Progressing   Problem: Coping: Goal: Ability to verbalize concerns and feelings about labor and delivery will improve Outcome: Progressing   Problem: Life Cycle: Goal: Ability to make normal progression through stages of labor will improve Outcome: Progressing Goal: Ability to effectively push during vaginal delivery will improve Outcome: Progressing   Problem: Role Relationship: Goal: Will demonstrate positive interactions with the child Outcome: Progressing   Problem: Safety: Goal: Risk of complications during labor and delivery will decrease Outcome: Progressing   Problem: Pain Management: Goal: Relief or control of pain from uterine contractions will improve Outcome: Progressing

## 2020-04-14 NOTE — H&P (Addendum)
Nicole Chase is a 24 y.o. female presenting for IOL for gest htn OB History    Gravida  2   Para      Term      Preterm      AB  1   Living  0     SAB  1   TAB      Ectopic      Multiple      Live Births             Past Medical History:  Diagnosis Date  . Exertional dyspnea 12/29/2019  . Gestational hypertension 12/29/2019  . Hearing loss   . Palpitations 12/29/2019  . Pancreatic cyst   . Pre-eclampsia   . Pregnancy induced hypertension   . 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 WISDOM TOOTH EXTRACTION     Family History: family history includes Breast cancer in her paternal grandmother; Healthy in her father and mother; Hearing loss in her mother; Von Hippel-Lindau syndrome in her maternal grandmother, maternal uncle, and mother. Social History:  reports that she has never smoked. She has never used smokeless tobacco. She reports that she does not drink alcohol and does not use drugs.     Maternal Diabetes: No Genetic Screening: Normal Maternal Ultrasounds/Referrals: Normal Fetal Ultrasounds or other Referrals:  None Maternal Substance Abuse:  No Significant Maternal Medications:  None Significant Maternal Lab Results:  Group B Strep negative Other Comments:  None VHL syndrome- autosomal dominant  Review of Systems  Constitutional: Negative.   All other systems reviewed and are negative.  Maternal Medical History:  Reason for admission: Contractions.   Contractions: Frequency: rare.   Perceived severity is mild.    Fetal activity: Perceived fetal activity is normal.   Last perceived fetal movement was within the past hour.    Prenatal complications: PIH.   Prenatal Complications - Diabetes: none.    Dilation: 1 Effacement (%): 50 Station: -3 Exam by:: Aleah Thorburn RN Blood pressure (!) 156/100, pulse 96, temperature 98.9 F (37.2 C), temperature source Oral, resp. rate 17,  height 5\' 8"  (1.727 m), weight 109.9 kg, last menstrual period 07/26/2019. Maternal Exam:  Uterine Assessment: Contraction strength is mild.  Contraction frequency is irregular.   Abdomen: Patient reports no abdominal tenderness. Fetal presentation: vertex  Introitus: Normal vulva. Normal vagina.  Ferning test: not done.  Nitrazine test: not done. Amniotic fluid character: not assessed.  Pelvis: questionable for delivery.   Cervix: Cervix evaluated by digital exam.     Physical Exam Vitals and nursing note reviewed.  Constitutional:      Appearance: Normal appearance.  HENT:     Head: Normocephalic and atraumatic.  Cardiovascular:     Rate and Rhythm: Normal rate and regular rhythm.  Pulmonary:     Effort: Pulmonary effort is normal.     Breath sounds: Normal breath sounds.  Abdominal:     General: Bowel sounds are normal.  Genitourinary:    General: Normal vulva.  Musculoskeletal:        General: Normal range of motion.     Cervical back: Normal range of motion and neck supple.  Skin:    General: Skin is warm.  Neurological:     General: No focal deficit present.     Mental Status: She is alert and oriented to person, place, and time.  Psychiatric:        Behavior: Behavior normal.  Prenatal labs: ABO, Rh: --/--/A POS Performed at [facility], [place] [address], [place], [place] [address]  [phone]) Antibody: NEG (08/06 0117) Rubella: Immune (01/12 0000) RPR: Nonreactive (01/12 0000)  HBsAg: Negative (01/12 0000)  HIV: Non-reactive (01/12 0000)  GBS:   neg  Assessment/Plan: Term IUP Gest HTN VHL syndrome- neg brain MRI, neg abdominal MRI(minor pancreatic lesions) IOL   Ida Milbrath J 04/14/2020, 6:21 AM

## 2020-04-15 ENCOUNTER — Encounter (HOSPITAL_COMMUNITY): Payer: Self-pay | Admitting: Obstetrics and Gynecology

## 2020-04-15 LAB — CBC
HCT: 34.8 % — ABNORMAL LOW (ref 36.0–46.0)
Hemoglobin: 11.7 g/dL — ABNORMAL LOW (ref 12.0–15.0)
MCH: 32 pg (ref 26.0–34.0)
MCHC: 33.6 g/dL (ref 30.0–36.0)
MCV: 95.1 fL (ref 80.0–100.0)
Platelets: 192 10*3/uL (ref 150–400)
RBC: 3.66 MIL/uL — ABNORMAL LOW (ref 3.87–5.11)
RDW: 13.2 % (ref 11.5–15.5)
WBC: 14 10*3/uL — ABNORMAL HIGH (ref 4.0–10.5)
nRBC: 0 % (ref 0.0–0.2)

## 2020-04-15 MED ORDER — ONDANSETRON HCL 4 MG PO TABS: 4.0000 mg | ORAL_TABLET | ORAL | Status: DC | PRN

## 2020-04-15 MED ORDER — SENNOSIDES-DOCUSATE SODIUM 8.6-50 MG PO TABS
2.0000 | ORAL_TABLET | ORAL | Status: DC
  Administered 2020-04-16 – 2020-04-17 (×2): 2 via ORAL
  Filled 2020-04-15 (×2): qty 2

## 2020-04-15 MED ORDER — OXYCODONE-ACETAMINOPHEN 5-325 MG PO TABS
1.0000 | ORAL_TABLET | ORAL | Status: DC | PRN
  Administered 2020-04-15 – 2020-04-17 (×5): 1 via ORAL
  Filled 2020-04-15 (×5): qty 1

## 2020-04-15 MED ORDER — WITCH HAZEL-GLYCERIN EX PADS: 1.0000 "application " | MEDICATED_PAD | CUTANEOUS | Status: DC | PRN

## 2020-04-15 MED ORDER — DIPHENHYDRAMINE HCL 25 MG PO CAPS: 25.0000 mg | ORAL_CAPSULE | Freq: Four times a day (QID) | ORAL | Status: DC | PRN

## 2020-04-15 MED ORDER — ZOLPIDEM TARTRATE 5 MG PO TABS: 5.0000 mg | ORAL_TABLET | Freq: Every evening | ORAL | Status: DC | PRN

## 2020-04-15 MED ORDER — BENZOCAINE-MENTHOL 20-0.5 % EX AERO
1.0000 "application " | INHALATION_SPRAY | CUTANEOUS | Status: DC | PRN
  Administered 2020-04-15: 1 via TOPICAL
  Filled 2020-04-15: qty 56

## 2020-04-15 MED ORDER — IBUPROFEN 600 MG PO TABS
600.0000 mg | ORAL_TABLET | Freq: Four times a day (QID) | ORAL | Status: DC
  Administered 2020-04-15 – 2020-04-17 (×10): 600 mg via ORAL
  Filled 2020-04-15 (×10): qty 1

## 2020-04-15 MED ORDER — ONDANSETRON HCL 4 MG/2ML IJ SOLN: 4.0000 mg | INTRAMUSCULAR | Status: DC | PRN

## 2020-04-15 MED ORDER — OXYCODONE-ACETAMINOPHEN 5-325 MG PO TABS: 2.0000 | ORAL_TABLET | ORAL | Status: DC | PRN

## 2020-04-15 MED ORDER — DIBUCAINE (PERIANAL) 1 % EX OINT: 1.0000 "application " | TOPICAL_OINTMENT | CUTANEOUS | Status: DC | PRN

## 2020-04-15 MED ORDER — COCONUT OIL OIL: 1.0000 "application " | TOPICAL_OIL | Status: DC | PRN

## 2020-04-15 MED ORDER — METHYLERGONOVINE MALEATE 0.2 MG/ML IJ SOLN: 0.2000 mg | INTRAMUSCULAR | Status: DC | PRN

## 2020-04-15 MED ORDER — ACETAMINOPHEN 325 MG PO TABS: 650.0000 mg | ORAL_TABLET | ORAL | Status: DC | PRN

## 2020-04-15 MED ORDER — SIMETHICONE 80 MG PO CHEW: 80.0000 mg | CHEWABLE_TABLET | ORAL | Status: DC | PRN

## 2020-04-15 MED ORDER — PRENATAL MULTIVITAMIN CH
1.0000 | ORAL_TABLET | Freq: Every day | ORAL | Status: DC
  Administered 2020-04-15 – 2020-04-17 (×3): 1 via ORAL
  Filled 2020-04-15 (×3): qty 1

## 2020-04-15 MED ORDER — TETANUS-DIPHTH-ACELL PERTUSSIS 5-2.5-18.5 LF-MCG/0.5 IM SUSP: 0.5000 mL | Freq: Once | INTRAMUSCULAR | Status: DC

## 2020-04-15 MED ORDER — METHYLERGONOVINE MALEATE 0.2 MG PO TABS: 0.2000 mg | ORAL_TABLET | ORAL | Status: DC | PRN

## 2020-04-15 NOTE — Plan of Care (Signed)
  Problem: Education: Goal: Knowledge of General Education information will improve Description: Including pain rating scale, medication(s)/side effects and non-pharmacologic comfort measures Outcome: Completed/Met   Problem: Health Behavior/Discharge Planning: Goal: Ability to manage health-related needs will improve Outcome: Completed/Met   Problem: Clinical Measurements: Goal: Ability to maintain clinical measurements within normal limits will improve Outcome: Completed/Met Goal: Will remain free from infection Outcome: Completed/Met Goal: Diagnostic test results will improve Outcome: Completed/Met Goal: Respiratory complications will improve Outcome: Completed/Met Goal: Cardiovascular complication will be avoided Outcome: Completed/Met   Problem: Activity: Goal: Risk for activity intolerance will decrease Outcome: Completed/Met   Problem: Nutrition: Goal: Adequate nutrition will be maintained Outcome: Completed/Met   Problem: Coping: Goal: Level of anxiety will decrease Outcome: Completed/Met   Problem: Elimination: Goal: Will not experience complications related to bowel motility Outcome: Completed/Met Goal: Will not experience complications related to urinary retention Outcome: Completed/Met   Problem: Pain Managment: Goal: General experience of comfort will improve Outcome: Completed/Met   Problem: Safety: Goal: Ability to remain free from injury will improve Outcome: Completed/Met   Problem: Skin Integrity: Goal: Risk for impaired skin integrity will decrease Outcome: Completed/Met   Problem: Education: Goal: Knowledge of Childbirth will improve Outcome: Completed/Met Goal: Ability to make informed decisions regarding treatment and plan of care will improve Outcome: Completed/Met Goal: Ability to state and carry out methods to decrease the pain will improve Outcome: Completed/Met Goal: Individualized Educational Video(s) Outcome: Completed/Met    Problem: Coping: Goal: Ability to verbalize concerns and feelings about labor and delivery will improve Outcome: Completed/Met   Problem: Life Cycle: Goal: Ability to make normal progression through stages of labor will improve Outcome: Completed/Met Goal: Ability to effectively push during vaginal delivery will improve Outcome: Completed/Met   Problem: Role Relationship: Goal: Will demonstrate positive interactions with the child Outcome: Completed/Met   Problem: Safety: Goal: Risk of complications during labor and delivery will decrease Outcome: Completed/Met   Problem: Pain Management: Goal: Relief or control of pain from uterine contractions will improve Outcome: Completed/Met   Problem: Education: Goal: Knowledge of Childbirth will improve Outcome: Completed/Met Goal: Ability to make informed decisions regarding treatment and plan of care will improve Outcome: Completed/Met Goal: Ability to state and carry out methods to decrease the pain will improve Outcome: Completed/Met Goal: Individualized Educational Video(s) Outcome: Completed/Met   Problem: Coping: Goal: Ability to verbalize concerns and feelings about labor and delivery will improve Outcome: Completed/Met   Problem: Life Cycle: Goal: Ability to make normal progression through stages of labor will improve Outcome: Completed/Met Goal: Ability to effectively push during vaginal delivery will improve Outcome: Completed/Met   Problem: Role Relationship: Goal: Will demonstrate positive interactions with the child Outcome: Completed/Met   Problem: Safety: Goal: Risk of complications during labor and delivery will decrease Outcome: Completed/Met   Problem: Pain Management: Goal: Relief or control of pain from uterine contractions will improve Outcome: Completed/Met

## 2020-04-15 NOTE — Anesthesia Postprocedure Evaluation (Signed)
Anesthesia Post Note  Patient: Nicole Chase  Procedure(s) Performed: AN AD HOC LABOR EPIDURAL     Patient location during evaluation: Mother Baby Anesthesia Type: Epidural Level of consciousness: awake and alert Pain management: pain level controlled Vital Signs Assessment: post-procedure vital signs reviewed and stable Respiratory status: spontaneous breathing Cardiovascular status: stable Postop Assessment: no headache, adequate PO intake, no backache, patient able to bend at knees, able to ambulate, epidural receding and no apparent nausea or vomiting Anesthetic complications: no   No complications documented.  Last Vitals:  Vitals:   04/15/20 0543 04/15/20 0630  BP: (!) 143/73 136/86  Pulse: (!) 116 100  Resp: 18 18  Temp: 37.1 C 37.8 C  SpO2: 99% 99%    Last Pain:  Vitals:   04/15/20 0630  TempSrc: Oral  PainSc: 5    Pain Goal:                   Ailene Ards

## 2020-04-15 NOTE — Lactation Note (Signed)
This note was copied from a baby's chart. Lactation Consultation Note  Patient Name: Nicole Chase [id number] Date: 04/15/2020 Reason for consult: Follow-up assessment;Mother's request;Difficult latch Mom requests assistance with latching baby to breast, states baby showing feeding cues. Baby calm and relaxed, used hand pump to help evert nipple, drops of colostrum easily expressed and finger fed to baby. LC unsuccessful in latching baby to breast, fitted mom for 105mm nipple shield, baby latched to shield but unable to draw breast tissue into nipple of shield, mom c/o pain, compression stripe noted. Baby placed skin to skin, this LC stepped out of room to consult with another LC. Hope S, LC in to see mom, attempted to latch to breast with and without nipple shield in football and cross cradle hold, unsuccessful. Hand expressed and finger fed colostrum to baby, baby content, tolerated well. Mom set up with DEBP, reviewed assembly, frequency, cleanup and milk storage. Breast shells given with instructions. Reinforced cue based feeding 8-12 in 24hrs, newborn behavior in first 24hrs, [facility] BF brochure with numbers for [facility] telephone and outpatient support. Mom has [facility], will notify [facility] once decides desired pump. Mom completed pump cycle, obtained ~82ml with DEBP, placed in vial at bedside with curved tip syringe to be fed at next feeding, advised to call as needed for [facility] support, otherwise will f/u tomorrow. Mom voiced understanding and with no further concerns. BGilliam, RN, IBCLC  Maternal Data Formula Feeding for Exclusion: No Has patient been taught Hand Expression?: Yes Does the patient have breastfeeding experience prior to this delivery?: No  Feeding    LATCH Score Latch: Too sleepy or reluctant, no latch achieved, no sucking elicited.  Audible Swallowing: None  Type of Nipple: Flat  Comfort (Breast/Nipple): Filling, red/small blisters or bruises, mild/mod discomfort  Hold  (Positioning): Assistance needed to correctly position infant at breast and maintain latch.  LATCH Score: 3  Interventions Interventions: Assisted with latch  Lactation Tools Discussed/Used WIC Program: No   Consult Status Consult Status: Follow-up Date: 04/16/20 Follow-up type: In-patient    Bernita Buffy 04/15/2020, 4:27 PM

## 2020-04-15 NOTE — Lactation Note (Signed)
This note was copied from a baby's chart. Lactation Consultation Note  Patient Name: Nicole Chase OIZTI'W Date: 04/15/2020 Reason for consult: Initial assessment 54 11hrs old, first time breastfeeding, primapara. Mom sitting in bed holding sleeping baby swaddled in blanket, dad asleep on couch, paternal grandmother sitting in chair. Mom states baby has not fed, denies feeding within 1st hour PP. Mom with flat nipples bilat, reviewed hand expression via teach back method, this LC obtained 1 drop bilat. Got baby up skin to skin in football hold right breast, attempted to latch, baby uninterested in feeding. Peds MD in to see mom, mom became tearful regarding news of baby's clavicle. [place] left room to will give mom time to process, will f/u later today. Advised mom to use hand pump before latching to help evert nipple, will evaluate need for nipple shield and or pumping on f/u. Mom voiced understanding and with no further concerns. BGilliam, RN, IBCLC  Maternal Data Formula Feeding for Exclusion: No Has patient been taught Hand Expression?: Yes Does the patient have breastfeeding experience prior to this delivery?: No  Feeding    LATCH Score Latch: Too sleepy or reluctant, no latch achieved, no sucking elicited.  Audible Swallowing: None  Type of Nipple: Flat  Comfort (Breast/Nipple): Soft / non-tender  Hold (Positioning): Assistance needed to correctly position infant at breast and maintain latch.  LATCH Score: 4  Interventions Interventions: Breast feeding basics reviewed;Assisted with latch;Skin to skin;Hand express;Support pillows;Position options;Hand pump  Lactation Tools Discussed/Used WIC Program: No   Consult Status Consult Status: Follow-up Date: 04/16/20 Follow-up type: In-patient    Nicole Chase 04/15/2020, 1:53 PM

## 2020-04-16 NOTE — Progress Notes (Addendum)
CSW received consult for hx of depression.  CSW met with MOB at bedside to offer support and complete assessment.  On arrival, CSW introduced self and stated purpose for visit. FOB and infant, Braelyn were present, however, after PPD/A and SIDS education, FOB stepped out of room to offer MOB privacy during assessment. MOB and FOB were pleasant and engaged during visit.   CSW provided education regarding the baby blues period vs. perinatal mood disorders, discussed treatment and gave resources for mental health follow up if concerns arise.  CSW recommends self-evaluation during the postpartum time period using the New Mom Checklist from Postpartum Progress and encouraged MOB and FOB to contact a medical professional if symptoms are noted at any time. MOB and FOB stated understanding and denied any questions.   CSW provided review of Sudden Infant Death Syndrome (SIDS) precautions. MOB and FOB stated understanding and denied any questions. MOB confirmed having all needed items for baby including car seat and crib and bassinet for baby's safe sleep.   During assessment, MOB reported hx of depression, anxiety, and ADD. MOB associated anxiety and depression with ADD and being in school. MOB stated school has been a bit more difficult and overwhelming since off Adderall. MOB decided to discontinue BH Rx when she found out she was pregnant. However, MOB stated plan to contact her psychiatrist Dr. R. Kaur and the baby's pediatrician for safe Rx while breastfeeding recommendations. MOB denied any SI, HI, or domestic violence. MOB identified mood as "happy but a little overwhelmed with breastfeeding". MOB encouraged herself by saying "I think it will get better" and "its only been one day". CSW offered additional encouragement and support. MOB identified FOB and mom as support, and community outings as coping strategy. MOB was appreciative of support and resources and denied any additional needs.     CSW identifies no  further need for intervention and no barriers to discharge at this time.  Bartosz Luginbill D. Maddock Finigan, MSW, LCSW Clinical Social Worker [phone] 

## 2020-04-16 NOTE — Lactation Note (Signed)
This note was copied from a baby's chart. Lactation Consultation Note  Patient Name: Nicole Chase [id number] Date: 04/16/2020 Reason for consult: Follow-up assessment;Difficult latch;Primapara;Early term 81-38.6wks Moms request.  Mom wants to go ahead and get her [facility] DEBP.  Mom requested Medela Pump N style Flex.  Issued Medela Pump N Style Flex. Mom has colostrum that she pumped earlier.  Infant cuing.  Mom fed the pumped colostrum via bottle and then dad followed up with formula.   Showed mom how to massage and do RPS with her hands and cocnut oil to soften the breast.  Also did hand expression.  Mom able to demo back and get some drops of colostrum.  Mom reports her nipples are too sore to try and breastfeed now.  Urged to continue to pump 8-12 times a day and do massage and reverse pressure softening first.  Urged mom to start wearing her shells but not sleep in them.  Urged mom to call lactation as needed.   Maternal Data Has patient been taught Hand Expression?: Yes  Feeding    LATCH Score                   Interventions Interventions: Breast feeding basics reviewed  Lactation Tools Discussed/Used     Consult Status Consult Status: Follow-up Date: 04/16/20 Follow-up type: In-patient    [facility] Thompson Caul 04/16/2020, 7:27 PM

## 2020-04-16 NOTE — Lactation Note (Signed)
This note was copied from a baby's chart. Lactation Consultation Note  Patient Name: Nicole Chase YELYH'T Date: 04/16/2020  Spoke with mom at bedside.  Discussed difficulty of infant latching at this time. Discussed with mom possibility of hopefully temporarily pumping and bottle feeding until the tissue becomes more compressible and infant able to latch.  Discussed continuing to work with latching infant as well.  Discussed adding some massage and coconut oil with Reverse pressure softening to breastfeeding and pumping. Mom wants to get her [facility] breastpump.  Reviewed pumps mom could get through her insurance. Urged mom to call at next feeding.   Maternal Data    Feeding    LATCH Score                   Interventions    Lactation Tools Discussed/Used     Consult Status      [facility] [place] Thompson Caul 04/16/2020, 6:09 PM

## 2020-04-16 NOTE — Progress Notes (Signed)
Post Partum Day 1 Subjective: no complaints, up ad lib, voiding, tolerating PO and + flatus\ Denies headaches , CP or SOB  Objective: Blood pressure 125/84, pulse 71, temperature (!) 96.7 F (35.9 C), temperature source Oral, resp. rate 17, height 5\' 8"  (1.727 m), weight 109.9 kg, last menstrual period 07/26/2019, SpO2 99 %, unknown if currently breastfeeding.  Physical Exam:  General: alert, cooperative and appears stated age Lochia: appropriate Uterine Fundus: firm Incision: healing well DVT Evaluation: No evidence of DVT seen on physical exam. Negative Homan's sign.  Recent Labs    04/14/20 0955 04/15/20 0616  HGB 12.2 11.7*  HCT 36.8 34.8*    Assessment/Plan: Stable PPD 1 Gest HTN now stable, no meds. Fu office one wk s/p DC for BP cki Plan for discharge tomorrow and Breastfeeding   LOS: 2 days   Nicole Chase 04/16/2020, 11:21 AM

## 2020-04-16 NOTE — Lactation Note (Signed)
This note was copied from a baby's chart. Lactation Consultation Note  Patient Name: Nicole Chase TWKMQ'[id number] Date: 04/16/2020   Moms request.  Baby Nicole very fussy on arrival.  Mom reports did not get anything last time she pumped.   Assisted with trying to get her latched both with and without the nipple shield.  She latched for a few minutes without nipple shield.  Unable to maintain. With nipple shield she is not able to draw mons nipple into the shield without compressing it.   Discussed adding donor milk to breastfeeding. Parents in agreement however no donor milk available at this time. RN plans to talk with parents regarding supplementation with formula if infant needs it.  Maternal Data    Feeding Feeding Type: Formula  LATCH Score                   Interventions    Lactation Tools Discussed/Used Tools: Nipple [facility] 04/16/2020, 12:32 AM

## 2020-04-17 MED ORDER — BUSPIRONE HCL 10 MG PO TABS: 10.0000 mg | ORAL_TABLET | Freq: Two times a day (BID) | ORAL | 1 refills | Status: DC

## 2020-04-17 MED ORDER — COCONUT OIL OIL: 1.0000 "application " | TOPICAL_OIL | 0 refills | Status: DC | PRN

## 2020-04-17 MED ORDER — BENZOCAINE-MENTHOL 20-0.5 % EX AERO: 1.0000 "application " | INHALATION_SPRAY | CUTANEOUS | 1 refills | Status: DC | PRN

## 2020-04-17 MED ORDER — NIFEDIPINE ER OSMOTIC RELEASE 30 MG PO TB24: 30.0000 mg | ORAL_TABLET | Freq: Every day | ORAL | Status: DC

## 2020-04-17 MED ORDER — IBUPROFEN 600 MG PO TABS: 600.0000 mg | ORAL_TABLET | Freq: Four times a day (QID) | ORAL | 0 refills | Status: DC

## 2020-04-17 MED ORDER — NIFEDIPINE ER 30 MG PO TB24: 30.0000 mg | ORAL_TABLET | Freq: Two times a day (BID) | ORAL | 0 refills | Status: DC

## 2020-04-17 MED ORDER — NIFEDIPINE ER OSMOTIC RELEASE 30 MG PO TB24
30.0000 mg | ORAL_TABLET | Freq: Two times a day (BID) | ORAL | Status: DC
  Administered 2020-04-17: 30 mg via ORAL
  Filled 2020-04-17: qty 1

## 2020-04-17 MED ORDER — BUSPIRONE HCL 5 MG PO TABS
10.0000 mg | ORAL_TABLET | Freq: Two times a day (BID) | ORAL | Status: DC
  Administered 2020-04-17: 10 mg via ORAL
  Filled 2020-04-17: qty 2

## 2020-04-17 MED ORDER — ACETAMINOPHEN 325 MG PO TABS: 650.0000 mg | ORAL_TABLET | ORAL | 1 refills | Status: DC | PRN

## 2020-04-17 MED FILL — NIFEdipine ER 30 MG TB24: 30 | 30 days supply | Qty: 60 | Fill #0

## 2020-04-17 MED FILL — busPIRone HCL 10 MG TABS: 10 | 30 days supply | Qty: 60 | Fill #0

## 2020-04-17 MED FILL — IBUPROFEN 600 MG TABLET: 600 | 7 days supply | Qty: 30 | Fill #0

## 2020-04-17 MED FILL — DERMOPLAST 20-0.5 % AERO: 20-0.5 | 15 days supply | Qty: 78 | Fill #0

## 2020-04-17 NOTE — Discharge Summary (Signed)
OB Discharge Summary  Patient Name: Nicole Chase DOB: [birth year] MRN: [id number]  Date of admission: 04/14/2020 Delivering provider: Brien Few   Admitting diagnosis: Encounter for induction of labor [Z34.90] Intrauterine pregnancy: [redacted]w[redacted]d     Secondary diagnosis: Patient Active Problem List   Diagnosis Date Noted  . SVD (spontaneous vaginal delivery) 8/7 04/15/2020  . Perineal laceration, second degree  04/15/2020  . Postpartum care following vaginal delivery 8/7 04/15/2020  . Encounter for induction of labor 04/14/2020  . Gestational hypertension 12/29/2019  . Palpitations 12/29/2019  . Exertional dyspnea 12/29/2019  . Tachycardia 12/27/2019  . Elevated BP without diagnosis of hypertension 12/27/2019   Additional problems: Anxiety   Date of discharge: 04/17/2020   Discharge diagnosis: Principal Problem:   Postpartum care following vaginal delivery 8/7 Active Problems:   Encounter for induction of labor   SVD (spontaneous vaginal delivery) 8/7   Perineal laceration, second degree                                                           Post partum procedures:None  Augmentation: AROM, Pitocin and Cytotec Pain control: Epidural  Laceration:2nd degree  Episiotomy:None  Complications: None  Hospital course:  Induction of Labor With Vaginal Delivery   24 y.o. yo G2P1011 at [redacted]w[redacted]d was admitted to the hospital 04/14/2020 for induction of labor.  Indication for induction: Gestational hypertension.  Patient had an uncomplicated labor course as follows: Membrane Rupture Time/Date: 12:27 PM ,04/14/2020   Delivery Method:Vaginal, Spontaneous  Episiotomy: None  Lacerations:  2nd degree  Details of delivery can be found in separate delivery note. Patient had a postpartum course complicated by gestational hypertension. PEC labs were WNL and patient denies PEC symptoms. BPs are 140/80s. Procardia 30mg  XL BID was started. Pt is also experiencing some anxiety and tearfulness postpartum  related to infant condition. Infant is currently under bili lights and has a left fractured clavicle. Discussed Buspar for anxiety and pt agrees. Rx sent to pharmacy. Patient is discharged to boarding status on 04/17/20.  Newborn Data: Birth date:04/15/2020  Birth time:2:34 AM  Gender:Female  Living status:Living  Apgars:5 ,8  Weight:3317 g   Physical exam  Vitals:   04/16/20 1328 04/16/20 1730 04/16/20 2201 04/17/20 0530  BP: 129/85  (!) 142/82 (!) 141/66  Pulse: 79  91 81  Resp: 17  17 17   Temp: 97.7 F (36.5 C)  98.2 F (36.8 C) 98.1 F (36.7 C)  TempSrc: Oral  Oral Oral  SpO2:  100% 100%   Weight:      Height:       General: alert, cooperative and no distress Lochia: appropriate Uterine Fundus: firm Perineum: repair intact, no edema DVT Evaluation: No evidence of DVT seen on physical exam. Labs: Lab Results  Component Value Date   WBC 14.0 (H) 04/15/2020   HGB 11.7 (L) 04/15/2020   HCT 34.8 (L) 04/15/2020   MCV 95.1 04/15/2020   PLT 192 04/15/2020   CMP Latest Ref Rng & Units 04/14/2020  Glucose 70 - 99 mg/dL 99  BUN 6 - 20 mg/dL 8  Creatinine 0.44 - 1.00 mg/dL 0.74  Sodium 135 - 145 mmol/L 134(L)  Potassium 3.5 - 5.1 mmol/L 4.0  Chloride 98 - 111 mmol/L 103  CO2 22 - 32 mmol/L 21(L)  Calcium 8.9 -  10.3 mg/dL 10.2  Total Protein 6.5 - 8.1 g/dL 6.6  Total Bilirubin 0.3 - 1.2 mg/dL 0.5  Alkaline Phos 38 - 126 U/L 95  AST 15 - 41 U/L 28  ALT 0 - 44 U/L 32   Edinburgh Postnatal Depression Scale Screening Tool 04/15/2020 04/15/2020  I have been able to laugh and see the funny side of things. 0 (No Data)  I have looked forward with enjoyment to things. 0 -  I have blamed myself unnecessarily when things went wrong. 1 -  I have been anxious or worried for no good reason. 2 -  I have felt scared or panicky for no good reason. 1 -  Things have been getting on top of me. 1 -  I have been so unhappy that I have had difficulty sleeping. 0 -  I have felt sad or miserable.  1 -  I have been so unhappy that I have been crying. 1 -  The thought of harming myself has occurred to me. 0 -  Edinburgh Postnatal Depression Scale Total 7 -   Vaccines: TDaP UTD         Flu    Declined  Discharge instruction:  per After Visit Summary,  [facility] OB booklet and  "[facility] [place]" hospital booklet  After Visit Meds:  Allergies as of 04/17/2020      Reactions   Cephalexin Hives      Medication List    STOP taking these medications   aspirin EC 81 MG tablet   promethazine 25 MG tablet Commonly known as: PHENERGAN     TAKE these medications   acetaminophen 325 MG tablet Commonly known as: Tylenol Take 2 tablets (650 mg total) by mouth every 4 (four) hours as needed (for pain scale < 4).   benzocaine-Menthol 20-0.5 % Aero Commonly known as: DERMOPLAST Apply 1 application topically as needed for irritation (perineal discomfort).   busPIRone 10 MG tablet Commonly known as: BUSPAR Take 1 tablet (10 mg total) by mouth 2 (two) times daily.   butalbital-acetaminophen-caffeine 50-325-40 MG tablet Commonly known as: FIORICET Take 1-2 tablets by mouth every 4 (four) hours as needed for headache.   coconut oil Oil Apply 1 application topically as needed.   ibuprofen 600 MG tablet Commonly known as: ADVIL Take 1 tablet (600 mg total) by mouth every 6 (six) hours.   NIFEdipine 30 MG 24 hr tablet Commonly known as: ADALAT CC Take 1 tablet (30 mg total) by mouth 2 (two) times daily.   PRENATAL VITAMIN PO Take 1 tablet by mouth daily.            Discharge Care Instructions  (From admission, onward)         Start     Ordered   04/17/20 0000  Discharge wound care:       Comments: Sitz baths 2 times /day with warm water x 1 week. May add herbals: 1 ounce dried comfrey leaf* 1 ounce calendula flowers 1 ounce lavender flowers  Supplies can be found online at [employer] sources at [employer], [employer]  1/2 ounce  dried uva ursi leaves 1/2 ounce witch hazel blossoms (if you can find them) 1/2 ounce dried sage leaf 1/2 cup sea salt Directions: Bring 2 quarts of water to a boil. Turn off heat, and place 1 ounce (approximately 1 large handful) of the above mixed herbs (not the salt) into the pot. Steep, covered, for 30 minutes.  Strain  the liquid well with a fine mesh strainer, and discard the herb material. Add 2 quarts of liquid to the tub, along with the 1/2 cup of salt. This medicinal liquid can also be made into compresses and peri-rinses.   04/17/20 1024         Diet: routine diet  Activity: Advance as tolerated. Pelvic rest for 6 weeks.   Postpartum contraception: Not Discussed  Newborn Data: Live born female  Birth Weight: 7 lb 5 oz (3317 g) APGAR: 4, 8  Newborn Delivery   Birth date/time: 04/15/2020 02:34:00 Delivery type: Vaginal, Spontaneous     Named Braelyn Baby Feeding: Breast Disposition:rooming in  Delivery Report:  Review the Delivery Report for details.    Follow up:  Follow-up Information    Obgyn, [facility]. Schedule an appointment as soon as possible for a visit in 1 week(s).   Why: Please make an appointment for 1 week postpartum, Monday, August 16, for blood pressure check and mood check.  Contact information: [address] [place] [address] [phone]              Signed: Zettie Pho, MSN 04/17/2020, 10:26 AM

## 2020-04-17 NOTE — Lactation Note (Addendum)
This note was copied from a baby's chart. Lactation Consultation Note  Patient Name: Girl Odelia Graciano TIRWE'R Date: 04/17/2020 Reason for consult: Follow-up assessment;Hyperbilirubinemia;Early term 37-38.6wks   P1, Baby 3 hours old and on double phototherapy.  Mother tearful.  Mother has had difficulty latching infant due to flat nipples but has plan now is to pump and bottle feed. She pumped last at approx 0400 with approx 5 ml expressed. Reassured mother about pumping volume and encouraged frequency. Mother is supplementing with donor milk.   Suggest mother call [facility] later today as needed.  LC to follow up.    Maternal Data    Feeding Feeding Type: Donor Breast Milk Nipple Type: Slow - flow  LATCH Score                   Interventions Interventions: DEBP  Lactation Tools Discussed/Used     Consult Status Consult Status: Follow-up Date: 04/18/20 Follow-up type: In-patient    Vivianne Master [facility] 04/17/2020, 9:36 AM

## 2020-04-18 ENCOUNTER — Ambulatory Visit: Payer: Self-pay

## 2020-04-18 NOTE — Lactation Note (Signed)
This note was copied from a baby's chart. Lactation Consultation Note  Patient Name: Nicole Chase VOHKG'O Date: 04/18/2020 Reason for consult: Follow-up assessment   P1, Baby 59 hours old on phototherapy.  [redacted]w[redacted]d  Weight increased. Mother is now pumping 30 ml.  Praised her for her efforts. Mother has DEBP.  Discussed OP appointment to work on latching if desired. Recommend prepumping before attempt and RPS.  Discussed pumping schedule and milk storage. Reviewed engorgement care and monitoring voids/stools.       Maternal Data    Feeding Feeding Type: Donor Breast Milk (and mom's EBM) Nipple Type: Slow - flow  LATCH Score                   Interventions Interventions: DEBP  Lactation Tools Discussed/Used     Consult Status Consult Status: Complete Date: 04/18/20    Vivianne Master [facility] 04/18/2020, 9:05 AM

## 2020-04-20 ENCOUNTER — Ambulatory Visit: Payer: No Typology Code available for payment source | Admitting: Legal Medicine

## 2020-05-04 MED FILL — buPROPion HCL ER (XL) 150 M: 150 | 30 days supply | Qty: 30 | Fill #0

## 2020-05-22 MED FILL — ALPRAZolam 0.5 MG TABS: 0.5 | 20 days supply | Qty: 20 | Fill #0

## 2020-06-08 MED FILL — buPROPion HCL ER (XL) 150 M: 150 | 90 days supply | Qty: 90 | Fill #0

## 2020-06-09 MED FILL — ADDERALL XR 20 MG CAP SA: 20 | 30 days supply | Qty: 30 | Fill #0

## 2020-06-15 ENCOUNTER — Telehealth: Payer: Self-pay | Admitting: Cardiovascular Disease

## 2020-06-15 NOTE — Telephone Encounter (Signed)
lvm for patient to return call to get follow up scheduled with [facility] from recall list  

## 2020-06-21 ENCOUNTER — Encounter: Payer: Self-pay | Admitting: Legal Medicine

## 2020-06-21 ENCOUNTER — Ambulatory Visit (INDEPENDENT_AMBULATORY_CARE_PROVIDER_SITE_OTHER): Payer: No Typology Code available for payment source | Admitting: Legal Medicine

## 2020-06-21 ENCOUNTER — Other Ambulatory Visit: Payer: Self-pay

## 2020-06-21 VITALS — BP 110/70 | HR 81 | Temp 97.7°F | Resp 16 | Ht 68.0 in | Wt 208.0 lb

## 2020-06-21 DIAGNOSIS — S43101A Unspecified dislocation of right acromioclavicular joint, initial encounter: Secondary | ICD-10-CM

## 2020-06-21 MED ORDER — TRAMADOL HCL 50 MG PO TABS: 50.0000 mg | ORAL_TABLET | Freq: Three times a day (TID) | ORAL | 0 refills | Status: AC | PRN

## 2020-06-21 MED ORDER — PREDNISONE 10 MG (21) PO TBPK: ORAL_TABLET | ORAL | 0 refills | Status: DC

## 2020-06-21 NOTE — Patient Instructions (Signed)
Shoulder Exercises Ask your health care provider which exercises are safe for you. Do exercises exactly as told by your health care provider and adjust them as directed. It is normal to feel mild stretching, pulling, tightness, or discomfort as you do these exercises. Stop right away if you feel sudden pain or your pain gets worse. Do not begin these exercises until told by your health care provider. Stretching exercises External rotation and abduction This exercise is sometimes called corner stretch. This exercise rotates your arm outward (external rotation) and moves your arm out from your body (abduction). 1. Stand in a doorway with one of your feet slightly in front of the other. This is called a staggered stance. If you cannot reach your forearms to the door frame, stand facing a corner of a room. 2. Choose one of the following positions as told by your health care provider: ? Place your hands and forearms on the door frame above your head. ? Place your hands and forearms on the door frame at the height of your head. ? Place your hands on the door frame at the height of your elbows. 3. Slowly move your weight onto your front foot until you feel a stretch across your chest and in the front of your shoulders. Keep your head and chest upright and keep your abdominal muscles tight. 4. Hold for __________ seconds. 5. To release the stretch, shift your weight to your back foot. Repeat __________ times. Complete this exercise __________ times a day. Extension, standing 1. Stand and hold a broomstick, a cane, or a similar object behind your back. ? Your hands should be a little wider than shoulder width apart. ? Your palms should face away from your back. 2. Keeping your elbows straight and your shoulder muscles relaxed, move the stick away from your body until you feel a stretch in your shoulders (extension). ? Avoid shrugging your shoulders while you move the stick. Keep your shoulder blades tucked  down toward the middle of your back. 3. Hold for __________ seconds. 4. Slowly return to the starting position. Repeat __________ times. Complete this exercise __________ times a day. Range-of-motion exercises Pendulum  1. Stand near a wall or a surface that you can hold onto for balance. 2. Bend at the waist and let your left / right arm hang straight down. Use your other arm to support you. Keep your back straight and do not lock your knees. 3. Relax your left / right arm and shoulder muscles, and move your hips and your trunk so your left / right arm swings freely. Your arm should swing because of the motion of your body, not because you are using your arm or shoulder muscles. 4. Keep moving your hips and trunk so your arm swings in the following directions, as told by your health care provider: ? Side to side. ? Forward and backward. ? In clockwise and counterclockwise circles. 5. Continue each motion for __________ seconds, or for as long as told by your health care provider. 6. Slowly return to the starting position. Repeat __________ times. Complete this exercise __________ times a day. Shoulder flexion, standing  1. Stand and hold a broomstick, a cane, or a similar object. Place your hands a little more than shoulder width apart on the object. Your left / right hand should be palm up, and your other hand should be palm down. 2. Keep your elbow straight and your shoulder muscles relaxed. Push the stick up with your healthy arm to   raise your left / right arm in front of your body, and then over your head until you feel a stretch in your shoulder (flexion). ? Avoid shrugging your shoulder while you raise your arm. Keep your shoulder blade tucked down toward the middle of your back. 3. Hold for __________ seconds. 4. Slowly return to the starting position. Repeat __________ times. Complete this exercise __________ times a day. Shoulder abduction, standing 1. Stand and hold a broomstick,  a cane, or a similar object. Place your hands a little more than shoulder width apart on the object. Your left / right hand should be palm up, and your other hand should be palm down. 2. Keep your elbow straight and your shoulder muscles relaxed. Push the object across your body toward your left / right side. Raise your left / right arm to the side of your body (abduction) until you feel a stretch in your shoulder. ? Do not raise your arm above shoulder height unless your health care provider tells you to do that. ? If directed, raise your arm over your head. ? Avoid shrugging your shoulder while you raise your arm. Keep your shoulder blade tucked down toward the middle of your back. 3. Hold for __________ seconds. 4. Slowly return to the starting position. Repeat __________ times. Complete this exercise __________ times a day. Internal rotation  1. Place your left / right hand behind your back, palm up. 2. Use your other hand to dangle an exercise band, a towel, or a similar object over your shoulder. Grasp the band with your left / right hand so you are holding on to both ends. 3. Gently pull up on the band until you feel a stretch in the front of your left / right shoulder. The movement of your arm toward the center of your body is called internal rotation. ? Avoid shrugging your shoulder while you raise your arm. Keep your shoulder blade tucked down toward the middle of your back. 4. Hold for __________ seconds. 5. Release the stretch by letting go of the band and lowering your hands. Repeat __________ times. Complete this exercise __________ times a day. Strengthening exercises External rotation  1. Sit in a stable chair without armrests. 2. Secure an exercise band to a stable object at elbow height on your left / right side. 3. Place a soft object, such as a folded towel or a small pillow, between your left / right upper arm and your body to move your elbow about 4 inches (10 cm) away  from your side. 4. Hold the end of the exercise band so it is tight and there is no slack. 5. Keeping your elbow pressed against the soft object, slowly move your forearm out, away from your abdomen (external rotation). Keep your body steady so only your forearm moves. 6. Hold for __________ seconds. 7. Slowly return to the starting position. Repeat __________ times. Complete this exercise __________ times a day. Shoulder abduction  1. Sit in a stable chair without armrests, or stand up. 2. Hold a __________ weight in your left / right hand, or hold an exercise band with both hands. 3. Start with your arms straight down and your left / right palm facing in, toward your body. 4. Slowly lift your left / right hand out to your side (abduction). Do not lift your hand above shoulder height unless your health care provider tells you that this is safe. ? Keep your arms straight. ? Avoid shrugging your shoulder while you   do this movement. Keep your shoulder blade tucked down toward the middle of your back. 5. Hold for __________ seconds. 6. Slowly lower your arm, and return to the starting position. Repeat __________ times. Complete this exercise __________ times a day. Shoulder extension 1. Sit in a stable chair without armrests, or stand up. 2. Secure an exercise band to a stable object in front of you so it is at shoulder height. 3. Hold one end of the exercise band in each hand. Your palms should face each other. 4. Straighten your elbows and lift your hands up to shoulder height. 5. Step back, away from the secured end of the exercise band, until the band is tight and there is no slack. 6. Squeeze your shoulder blades together as you pull your hands down to the sides of your thighs (extension). Stop when your hands are straight down by your sides. Do not let your hands go behind your body. 7. Hold for __________ seconds. 8. Slowly return to the starting position. Repeat __________ times.  Complete this exercise __________ times a day. Shoulder row 1. Sit in a stable chair without armrests, or stand up. 2. Secure an exercise band to a stable object in front of you so it is at waist height. 3. Hold one end of the exercise band in each hand. Position your palms so that your thumbs are facing the ceiling (neutral position). 4. Bend each of your elbows to a 90-degree angle (right angle) and keep your upper arms at your sides. 5. Step back until the band is tight and there is no slack. 6. Slowly pull your elbows back behind you. 7. Hold for __________ seconds. 8. Slowly return to the starting position. Repeat __________ times. Complete this exercise __________ times a day. Shoulder press-ups  1. Sit in a stable chair that has armrests. Sit upright, with your feet flat on the floor. 2. Put your hands on the armrests so your elbows are bent and your fingers are pointing forward. Your hands should be about even with the sides of your body. 3. Push down on the armrests and use your arms to lift yourself off the chair. Straighten your elbows and lift yourself up as much as you comfortably can. ? Move your shoulder blades down, and avoid letting your shoulders move up toward your ears. ? Keep your feet on the ground. As you get stronger, your feet should support less of your body weight as you lift yourself up. 4. Hold for __________ seconds. 5. Slowly lower yourself back into the chair. Repeat __________ times. Complete this exercise __________ times a day. Wall push-ups  1. Stand so you are facing a stable wall. Your feet should be about one arm-length away from the wall. 2. Lean forward and place your palms on the wall at shoulder height. 3. Keep your feet flat on the floor as you bend your elbows and lean forward toward the wall. 4. Hold for __________ seconds. 5. Straighten your elbows to push yourself back to the starting position. Repeat __________ times. Complete this exercise  __________ times a day. This information is not intended to replace advice given to you by your health care provider. Make sure you discuss any questions you have with your health care provider. Document Revised: 12/18/2018 Document Reviewed: 09/25/2018 [employer] Patient Education  2020 [employer]  

## 2020-06-21 NOTE — Progress Notes (Signed)
Acute Office Visit  Subjective:    Patient ID: Nicole Chase, female    DOB: [birth year], 24 y.o.   MRN: [id number]  Chief Complaint  Patient presents with  . Shoulder Pain    Right shoulder pain since one week ago.    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  No pops but she felts pain.  She has full rom right shoulder.  No past injury. Pain in [facility] joint..  Past Medical History:  Diagnosis Date  . Exertional dyspnea 12/29/2019  . Gestational hypertension 12/29/2019  . Hearing loss   . Palpitations 12/29/2019  . Pancreatic cyst   . Pre-eclampsia   . Pregnancy induced hypertension   . 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 WISDOM TOOTH EXTRACTION      Family History  Problem Relation Age of Onset  . Healthy Mother   . Von Hippel-Lindau syndrome Mother   . Hearing loss Mother   . Healthy Father   . Breast cancer Paternal Grandmother   . Von Hippel-Lindau syndrome Maternal Uncle   . Von Hippel-Lindau syndrome Maternal Grandmother     Social History   Socioeconomic History  . Marital status: Single    Spouse name: Not on file  . Number of children: 1  . Years of education: Not on file  . Highest education level: Not on file  Occupational History  . Occupation: Therapist, sports  Tobacco Use  . Smoking status: Never Smoker  . Smokeless tobacco: Never Used  Vaping Use  . Vaping Use: Never used  Substance and Sexual Activity  . Alcohol use: Yes    Alcohol/week: 3.0 standard drinks    Types: 3 Glasses of wine per week    Comment: ocassionally  . Drug use: No  . Sexual activity: Yes    Partners: Male    Birth control/protection: None  Other Topics Concern  . Not on file  Social History Narrative  . Not on file   Social Determinants of Health   Financial Resource Strain:   . Difficulty of Paying Living Expenses: Not on file  Food Insecurity:   . Worried About  Charity fundraiser in the Last Year: Not on file  . Ran Out of Food in the Last Year: Not on file  Transportation Needs:   . Lack of Transportation (Medical): Not on file  . Lack of Transportation (Non-Medical): Not on file  Physical Activity:   . Days of Exercise per Week: Not on file  . Minutes of Exercise per Session: Not on file  Stress:   . Feeling of Stress : Not on file  Social Connections:   . Frequency of Communication with Friends and Family: Not on file  . Frequency of Social Gatherings with Friends and Family: Not on file  . Attends Religious Services: Not on file  . Active Member of Clubs or Organizations: Not on file  . Attends Archivist Meetings: Not on file  . Marital Status: Not on file  Intimate Partner Violence:   . Fear of Current or Ex-Partner: Not on file  . Emotionally Abused: Not on file  . Physically Abused: Not on file  . Sexually Abused: Not on file    Outpatient Medications Prior to Visit  Medication Sig Dispense Refill  . ADDERALL XR 20 MG 24 hr capsule Take 20 mg by mouth daily.    Marland Kitchen  ALPRAZolam (XANAX) 0.5 MG tablet Take 0.5 mg by mouth daily.    Marland Kitchen buPROPion (WELLBUTRIN XL) 150 MG 24 hr tablet Take 150 mg by mouth daily.    Marland Kitchen acetaminophen (TYLENOL) 325 MG tablet Take 2 tablets (650 mg total) by mouth every 4 (four) hours as needed (for pain scale < 4). 30 tablet 1  . benzocaine-Menthol (DERMOPLAST) 20-0.5 % AERO Apply 1 application topically as needed for irritation (perineal discomfort). 56 g 1  . busPIRone (BUSPAR) 10 MG tablet Take 1 tablet (10 mg total) by mouth 2 (two) times daily. 60 tablet 1  . butalbital-acetaminophen-caffeine (FIORICET) 50-325-40 MG tablet Take 1-2 tablets by mouth every 4 (four) hours as needed for headache.    . coconut oil OIL Apply 1 application topically as needed.  0  . ibuprofen (ADVIL) 600 MG tablet Take 1 tablet (600 mg total) by mouth every 6 (six) hours. 30 tablet 0  . NIFEdipine (ADALAT CC) 30 MG 24 hr  tablet Take 1 tablet (30 mg total) by mouth 2 (two) times daily. 60 tablet 0  . Prenatal Vit-Fe Fumarate-FA (PRENATAL VITAMIN PO) Take 1 tablet by mouth daily.      No facility-administered medications prior to visit.    Allergies  Allergen Reactions  . Cephalexin Hives    Review of Systems  Constitutional: Negative.   HENT: Negative.   Eyes: Negative.   Respiratory: Negative.   Cardiovascular: Negative.  Negative for chest pain, palpitations and leg swelling.  Genitourinary: Negative.   Musculoskeletal:       Right shoulder pain  Neurological: Negative.        Objective:    Physical Exam Vitals reviewed.  Constitutional:      Appearance: Normal appearance.  Cardiovascular:     Rate and Rhythm: Normal rate and regular rhythm.     Pulses: Normal pulses.     Heart sounds: Normal heart sounds.  Pulmonary:     Effort: Pulmonary effort is normal.     Breath sounds: Normal breath sounds.  Musculoskeletal:     Right shoulder: Tenderness present.       Arms:     Cervical back: Normal range of motion and neck supple.     Comments: Patient has maximal pain at right [facility] joint.  Skin:    General: Skin is warm and dry.     Capillary Refill: Capillary refill takes less than 2 seconds.  Neurological:     General: No focal deficit present.     Mental Status: She is alert and oriented to person, place, and time.     BP 110/70   Pulse 81   Temp 97.7 F (36.5 C)   Resp 16   Ht 5\' 8"  (1.727 m)   Wt 208 lb (94.3 kg)   SpO2 98%   BMI 31.63 kg/m  Wt Readings from Last 3 Encounters:  06/21/20 208 lb (94.3 kg)  04/14/20 242 lb 3.2 oz (109.9 kg)  01/26/20 218 lb (98.9 kg)    Health Maintenance Due  Topic Date Due  . Hepatitis C Screening  Never done  . COVID-19 Vaccine (1) Never done  . TETANUS/TDAP  Never done  . PAP-Cervical Cytology Screening  Never done  . PAP SMEAR-Modifier  Never done  . INFLUENZA VACCINE  Never done    There are no preventive care reminders  to display for this patient.   Lab Results  Component Value Date   TSH 0.693 12/27/2019   Lab Results  Component  Value Date   WBC 14.0 (H) 04/15/2020   HGB 11.7 (L) 04/15/2020   HCT 34.8 (L) 04/15/2020   MCV 95.1 04/15/2020   PLT 192 04/15/2020   Lab Results  Component Value Date   NA 134 (L) 04/14/2020   K 4.0 04/14/2020   CO2 21 (L) 04/14/2020   GLUCOSE 99 04/14/2020   BUN 8 04/14/2020   CREATININE 0.74 04/14/2020   BILITOT 0.5 04/14/2020   ALKPHOS 95 04/14/2020   AST 28 04/14/2020   ALT 32 04/14/2020   PROT 6.6 04/14/2020   ALBUMIN 2.7 (L) 04/14/2020   CALCIUM 10.2 04/14/2020   ANIONGAP 10 04/14/2020   No results found for: CHOL No results found for: HDL No results found for: LDLCALC No results found for: TRIG No results found for: CHOLHDL No results found for: HGBA1C     Assessment & Plan:  1. Separation of right acromioclavicular joint, initial encounter - predniSONE (STERAPRED UNI-PAK 21 TAB) 10 MG (21) TBPK tablet; Take 6ills first day , then 5 pills day 2 and then cut down one pill day until gone  Dispense: 21 tablet; Refill: 0 - traMADol (ULTRAM) 50 MG tablet; Take 1 tablet (50 mg total) by mouth every 8 (eight) hours as needed for up to 5 days.  Dispense: 15 tablet; Refill: 0 Patient given steroids and mild pain reliever.  We discussed the pathology and expected healing.  ROM exercises given.   Meds ordered this encounter  Medications  . predniSONE (STERAPRED UNI-PAK 21 TAB) 10 MG (21) TBPK tablet    Sig: Take 6ills first day , then 5 pills day 2 and then cut down one pill day until gone    Dispense:  21 tablet    Refill:  0  . traMADol (ULTRAM) 50 MG tablet    Sig: Take 1 tablet (50 mg total) by mouth every 8 (eight) hours as needed for up to 5 days.    Dispense:  15 tablet    Refill:  0    No orders of the defined types were placed in this encounter.    Follow-up: Return in about 2 weeks (around 07/05/2020) for right shoulder.  An After Visit  Summary was printed and given to the patient.  [place] [phone]

## 2020-07-05 ENCOUNTER — Encounter: Payer: Self-pay | Admitting: Legal Medicine

## 2020-07-05 ENCOUNTER — Other Ambulatory Visit: Payer: Self-pay | Admitting: Legal Medicine

## 2020-07-05 ENCOUNTER — Ambulatory Visit (INDEPENDENT_AMBULATORY_CARE_PROVIDER_SITE_OTHER): Payer: No Typology Code available for payment source | Admitting: Legal Medicine

## 2020-07-05 ENCOUNTER — Other Ambulatory Visit: Payer: Self-pay

## 2020-07-05 VITALS — BP 110/60 | HR 93 | Temp 97.5°F | Resp 16 | Ht 68.0 in | Wt 207.0 lb

## 2020-07-05 DIAGNOSIS — S43101D Unspecified dislocation of right acromioclavicular joint, subsequent encounter: Secondary | ICD-10-CM | POA: Diagnosis not present

## 2020-07-05 MED ORDER — PREDNISONE 10 MG (21) PO TBPK: ORAL_TABLET | ORAL | 0 refills | Status: DC

## 2020-07-05 MED FILL — predniSONE 10 MG (21) TBPK: 10 | 6 days supply | Qty: 21 | Fill #0

## 2020-07-05 NOTE — Progress Notes (Signed)
Subjective:  Patient ID: Nicole Chase, female    DOB: [birth year]  Age: 24 y.o. MRN: [id number]  Chief Complaint  Patient presents with  . Shoulder Pain    Pain on right shoulder is not getting better.    HPI: patient having less shoulder pain but still has some problems with rotation.  It was fine on prednisone. I recommended continue home PT and possible orthopedic consult.  Patient wants to wait.   Current Outpatient Medications on File Prior to Visit  Medication Sig Dispense Refill  . ADDERALL XR 20 MG 24 hr capsule Take 20 mg by mouth daily.    Marland Kitchen ALPRAZolam (XANAX) 0.5 MG tablet Take 0.5 mg by mouth daily.    Marland Kitchen buPROPion (WELLBUTRIN XL) 150 MG 24 hr tablet Take 150 mg by mouth daily.     No current facility-administered medications on file prior to visit.   Past Medical History:  Diagnosis Date  . Exertional dyspnea 12/29/2019  . Gestational hypertension 12/29/2019  . Hearing loss   . Palpitations 12/29/2019  . Pancreatic cyst   . Pre-eclampsia   . Pregnancy induced hypertension   . 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 WISDOM TOOTH EXTRACTION      Family History  Problem Relation Age of Onset  . Healthy Mother   . Von Hippel-Lindau syndrome Mother   . Hearing loss Mother   . Healthy Father   . Breast cancer Paternal Grandmother   . Von Hippel-Lindau syndrome Maternal Uncle   . Von Hippel-Lindau syndrome Maternal Grandmother    Social History   Socioeconomic History  . Marital status: Single    Spouse name: Not on file  . Number of children: 1  . Years of education: Not on file  . Highest education level: Not on file  Occupational History  . Occupation: Therapist, sports  Tobacco Use  . Smoking status: Never Smoker  . Smokeless tobacco: Never Used  Vaping Use  . Vaping Use: Never used  Substance and Sexual Activity  . Alcohol use: Yes    Alcohol/week: 3.0 standard drinks    Types: 3 Glasses of wine per week      Comment: ocassionally  . Drug use: No  . Sexual activity: Yes    Partners: Male    Birth control/protection: None  Other Topics Concern  . Not on file  Social History Narrative  . Not on file   Social Determinants of Health   Financial Resource Strain:   . Difficulty of Paying Living Expenses: Not on file  Food Insecurity:   . Worried About Charity fundraiser in the Last Year: Not on file  . Ran Out of Food in the Last Year: Not on file  Transportation Needs:   . Lack of Transportation (Medical): Not on file  . Lack of Transportation (Non-Medical): Not on file  Physical Activity:   . Days of Exercise per Week: Not on file  . Minutes of Exercise per Session: Not on file  Stress:   . Feeling of Stress : Not on file  Social Connections:   . Frequency of Communication with Friends and Family: Not on file  . Frequency of Social Gatherings with Friends and Family: Not on file  . Attends Religious Services: Not on file  . Active Member of Clubs or Organizations: Not on file  . Attends Archivist Meetings: Not on file  . Marital Status: Not on  file    Review of Systems  Constitutional: Negative.   HENT: Negative.   Respiratory: Negative.  Negative for cough.   Cardiovascular: Negative for chest pain, palpitations and leg swelling.  Gastrointestinal: Negative.   Genitourinary: Negative.   Musculoskeletal: Positive for arthralgias (right shoulder).  Neurological: Negative.   Psychiatric/Behavioral: Negative.      Objective:  BP 110/60   Pulse 93   Temp (!) 97.5 F (36.4 C)   Resp 16   Ht 5\' 8"  (1.727 m)   Wt 207 lb (93.9 kg)   SpO2 98%   BMI 31.47 kg/m   BP/Weight 07/05/2020 38/33/3832 05/10/9165  Systolic BP 060 045 997  Diastolic BP 60 70 80  Wt. (Lbs) 207 208 -  BMI 31.47 31.63 -    Physical Exam Vitals reviewed.  Constitutional:      Appearance: Normal appearance.  HENT:     Right Ear: Tympanic membrane normal.     Left Ear: Tympanic  membrane normal.  Cardiovascular:     Rate and Rhythm: Normal rate and regular rhythm.     Pulses: Normal pulses.     Heart sounds: Normal heart sounds.  Pulmonary:     Effort: Pulmonary effort is normal.     Breath sounds: Normal breath sounds.  Musculoskeletal:     Cervical back: Normal range of motion and neck supple.     Comments: Right shoulder full abduction and flexion.  Pain on IR and picking up things.  maximal pain in long arm biceps  Skin:    General: Skin is warm and dry.     Capillary Refill: Capillary refill takes less than 2 seconds.  Neurological:     General: No focal deficit present.     Mental Status: She is alert and oriented to person, place, and time.  Psychiatric:        Mood and Affect: Mood normal.       Lab Results  Component Value Date   WBC 14.0 (H) 04/15/2020   HGB 11.7 (L) 04/15/2020   HCT 34.8 (L) 04/15/2020   PLT 192 04/15/2020   GLUCOSE 99 04/14/2020   ALT 32 04/14/2020   AST 28 04/14/2020   NA 134 (L) 04/14/2020   K 4.0 04/14/2020   CL 103 04/14/2020   CREATININE 0.74 04/14/2020   BUN 8 04/14/2020   CO2 21 (L) 04/14/2020   TSH 0.693 12/27/2019      Assessment & Plan:   1. Separation of right acromioclavicular joint, subsequent encounter Pain also in long arm of biceps.  We discussed MRI but patient wants to wait.  Try prednisone and follow up 2 weeks  Diagnoses and all orders for this visit: Separation of right acromioclavicular joint, subsequent encounter -     predniSONE (STERAPRED UNI-PAK 21 TAB) 10 MG (21) TBPK tablet; Take 6ills first day , then 5 pills day 2 and then cut down one pill day until gone          Follow-up: Return in about 2 weeks (around 07/19/2020).  An After Visit Summary was printed and given to the patient.  [place] [phone]

## 2020-07-19 ENCOUNTER — Ambulatory Visit: Payer: No Typology Code available for payment source | Admitting: Legal Medicine

## 2020-07-19 ENCOUNTER — Other Ambulatory Visit (HOSPITAL_COMMUNITY): Payer: Self-pay | Admitting: Specialist

## 2020-07-19 MED FILL — ADDERALL XR 20 MG CAP SA: 20 | 90 days supply | Qty: 90 | Fill #0

## 2020-07-19 MED FILL — ALPRAZolam 0.25 MG TABS: 0.25 | 30 days supply | Qty: 60 | Fill #0

## 2020-07-21 MED FILL — AMPHETAMINE-DEXTROAMPHETAMI: 20 | 90 days supply | Qty: 90 | Fill #0

## 2020-07-24 MED FILL — ESCITALOPRAM 10 MG TABLET: 10 | 90 days supply | Qty: 90 | Fill #0

## 2020-07-26 MED FILL — buPROPion HCL ER (XL) 300 M: 300 | 90 days supply | Qty: 90 | Fill #0

## 2020-08-16 ENCOUNTER — Encounter: Payer: Self-pay | Admitting: Physician Assistant

## 2020-08-16 ENCOUNTER — Other Ambulatory Visit: Payer: Self-pay

## 2020-08-16 ENCOUNTER — Ambulatory Visit (INDEPENDENT_AMBULATORY_CARE_PROVIDER_SITE_OTHER): Payer: No Typology Code available for payment source | Admitting: Physician Assistant

## 2020-08-16 VITALS — BP 110/74 | HR 74 | Temp 97.3°F | Ht 68.0 in | Wt 203.2 lb

## 2020-08-16 DIAGNOSIS — R5383 Other fatigue: Secondary | ICD-10-CM | POA: Diagnosis not present

## 2020-08-16 DIAGNOSIS — N926 Irregular menstruation, unspecified: Secondary | ICD-10-CM | POA: Insufficient documentation

## 2020-08-16 DIAGNOSIS — M25511 Pain in right shoulder: Secondary | ICD-10-CM | POA: Insufficient documentation

## 2020-08-16 LAB — POCT URINE PREGNANCY: Preg Test, Ur: NEGATIVE

## 2020-08-16 NOTE — Progress Notes (Signed)
Acute Office Visit  Subjective:    Patient ID: Nicole Chase, female    DOB: 06-Aug-1996, 24 y.o.   MRN: [id number]  Chief Complaint  Patient presents with  . Amenorrhea    HPI Patient is in today for missed period - patient states her LMP was 07/02/2020 - she just had a baby in August and has only had 2 periods since that time - says both of those were very light She has taken 3 home tests which were negative She is having some nausea and malaise as well  Pt saw Dr Henrene Pastor about right shoulder pain a few weeks ago - complains of right shoulder popping and with pain with certain movements - she had held off on getting MRI but states if pregnancy test is negative would like to get MRI done States if pregnancy blood test is negative she would like to proceed with workup of right shoulder  Past Medical History:  Diagnosis Date  . Exertional dyspnea 12/29/2019  . Gestational hypertension 12/29/2019  . Hearing loss   . Palpitations 12/29/2019  . Pancreatic cyst   . Pre-eclampsia   . Pregnancy induced hypertension   . 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 WISDOM TOOTH EXTRACTION      Family History  Problem Relation Age of Onset  . Healthy Mother   . Von Hippel-Lindau syndrome Mother   . Hearing loss Mother   . Healthy Father   . Breast cancer Paternal Grandmother   . Von Hippel-Lindau syndrome Maternal Uncle   . Von Hippel-Lindau syndrome Maternal Grandmother     Social History   Socioeconomic History  . Marital status: Single    Spouse name: Not on file  . Number of children: 1  . Years of education: Not on file  . Highest education level: Not on file  Occupational History  . Occupation: Therapist, sports  Tobacco Use  . Smoking status: Never Smoker  . Smokeless tobacco: Never Used  Vaping Use  . Vaping Use: Never used  Substance and Sexual Activity  . Alcohol use: Yes    Alcohol/week: 3.0 standard drinks    Types:  3 Glasses of wine per week    Comment: ocassionally  . Drug use: No  . Sexual activity: Yes    Partners: Male    Birth control/protection: None  Other Topics Concern  . Not on file  Social History Narrative  . Not on file   Social Determinants of Health   Financial Resource Strain:   . Difficulty of Paying Living Expenses: Not on file  Food Insecurity:   . Worried About Charity fundraiser in the Last Year: Not on file  . Ran Out of Food in the Last Year: Not on file  Transportation Needs:   . Lack of Transportation (Medical): Not on file  . Lack of Transportation (Non-Medical): Not on file  Physical Activity:   . Days of Exercise per Week: Not on file  . Minutes of Exercise per Session: Not on file  Stress:   . Feeling of Stress : Not on file  Social Connections:   . Frequency of Communication with Friends and Family: Not on file  . Frequency of Social Gatherings with Friends and Family: Not on file  . Attends Religious Services: Not on file  . Active Member of Clubs or Organizations: Not on file  . Attends Archivist Meetings: Not on file  .  Marital Status: Not on file  Intimate Partner Violence:   . Fear of Current or Ex-Partner: Not on file  . Emotionally Abused: Not on file  . Physically Abused: Not on file  . Sexually Abused: Not on file    Outpatient Medications Prior to Visit  Medication Sig Dispense Refill  . ADDERALL XR 20 MG 24 hr capsule Take 20 mg by mouth daily.    Marland Kitchen ALPRAZolam (XANAX) 0.25 MG tablet Take 0.25-0.5 mg by mouth daily as needed.    Marland Kitchen amphetamine-dextroamphetamine (ADDERALL) 20 MG tablet Take 20 mg by mouth daily.    Marland Kitchen buPROPion (WELLBUTRIN XL) 150 MG 24 hr tablet Take 150 mg by mouth daily.    Marland Kitchen escitalopram (LEXAPRO) 10 MG tablet Take 10 mg by mouth daily.    Marland Kitchen ALPRAZolam (XANAX) 0.5 MG tablet Take 0.5 mg by mouth daily.    Marland Kitchen buPROPion (WELLBUTRIN XL) 300 MG 24 hr tablet Take 300 mg by mouth every morning.    . predniSONE  (STERAPRED UNI-PAK 21 TAB) 10 MG (21) TBPK tablet Take 6ills first day , then 5 pills day 2 and then cut down one pill day until gone 21 tablet 0   No facility-administered medications prior to visit.    Allergies  Allergen Reactions  . Cephalexin Hives    Review of Systems CONSTITUTIONAL: see HPI  E/N/T: Negative for ear pain, nasal congestion and sore throat.  CARDIOVASCULAR: Negative for chest pain, dizziness, palpitations and pedal edema.  RESPIRATORY: Negative for recent cough and dyspnea.  GASTROINTESTINAL: Negative for abdominal pain, acid reflux symptoms, constipation, diarrhea, nausea and vomiting.  MSK: see HPI  INTEGUMENTARY: Negative for rash.  NEUROLOGICAL: Negative for dizziness and headaches.  PSYCHIATRIC: Negative for sleep disturbance and to question depression screen.  Negative for depression, negative for anhedonia.         Objective:    Physical Exam PHYSICAL EXAM:   VS: BP 110/74 (BP Location: Left Arm, Patient Position: Sitting, Cuff Size: Normal)   Pulse 74   Temp (!) 97.3 F (36.3 C) (Temporal)   Ht 5\' 8"  (1.727 m)   Wt 203 lb 3.2 oz (92.2 kg)   SpO2 97%   BMI 30.90 kg/m   GEN: Well nourished, well developed, in no acute distress  Cardiac: RRR; no murmurs, rubs, or gallops,no edema -  Respiratory:  normal respiratory rate and pattern with no distress - normal breath sounds with no rales, rhonchi, wheezes or rubs GI: normal bowel sounds, no masses or tenderness Psych: euthymic mood, appropriate affect and demeanor      BP 110/74 (BP Location: Left Arm, Patient Position: Sitting, Cuff Size: Normal)   Pulse 74   Temp (!) 97.3 F (36.3 C) (Temporal)   Ht 5\' 8"  (1.727 m)   Wt 203 lb 3.2 oz (92.2 kg)   SpO2 97%   BMI 30.90 kg/m  Wt Readings from Last 3 Encounters:  08/16/20 203 lb 3.2 oz (92.2 kg)  07/05/20 207 lb (93.9 kg)  06/21/20 208 lb (94.3 kg)    Health Maintenance Due  Topic Date Due  . Hepatitis C Screening  Never done  .  COVID-19 Vaccine (1) Never done  . TETANUS/TDAP  Never done  . PAP-Cervical Cytology Screening  Never done  . PAP SMEAR-Modifier  Never done    There are no preventive care reminders to display for this patient.   Lab Results  Component Value Date   TSH 0.693 12/27/2019   Lab Results  Component  Value Date   WBC 14.0 (H) 04/15/2020   HGB 11.7 (L) 04/15/2020   HCT 34.8 (L) 04/15/2020   MCV 95.1 04/15/2020   PLT 192 04/15/2020   Lab Results  Component Value Date   NA 134 (L) 04/14/2020   K 4.0 04/14/2020   CO2 21 (L) 04/14/2020   GLUCOSE 99 04/14/2020   BUN 8 04/14/2020   CREATININE 0.74 04/14/2020   BILITOT 0.5 04/14/2020   ALKPHOS 95 04/14/2020   AST 28 04/14/2020   ALT 32 04/14/2020   PROT 6.6 04/14/2020   ALBUMIN 2.7 (L) 04/14/2020   CALCIUM 10.2 04/14/2020   ANIONGAP 10 04/14/2020   No results found for: CHOL No results found for: HDL No results found for: LDLCALC No results found for: TRIG No results found for: CHOLHDL No results found for: HGBA1C     Assessment & Plan:  1. Missed period - POCT urine pregnancy - CBC with Differential/Platelet - Comprehensive metabolic panel - TSH - Beta hCG quant (ref lab)  2. Other fatigue - CBC with Differential/Platelet - Comprehensive metabolic panel - TSH  3. Acute pain of right shoulder  will order MRI if hcg negative  No orders of the defined types were placed in this encounter.   Orders Placed This Encounter  Procedures  . CBC with Differential/Platelet  . Comprehensive metabolic panel  . TSH  . Beta hCG quant (ref lab)  . POCT urine pregnancy     Follow-up: No follow-ups on file.  An After Visit Summary was printed and given to the patient.  Yetta Flock [facility] [phone]

## 2020-08-17 LAB — CBC WITH DIFFERENTIAL/PLATELET
Basophils Absolute: 0.1 10*3/uL (ref 0.0–0.2)
Basos: 1 %
EOS (ABSOLUTE): 0.1 10*3/uL (ref 0.0–0.4)
Eos: 1 %
Hematocrit: 45.3 % (ref 34.0–46.6)
Hemoglobin: 15.3 g/dL (ref 11.1–15.9)
Immature Grans (Abs): 0 10*3/uL (ref 0.0–0.1)
Immature Granulocytes: 0 %
Lymphocytes Absolute: 3.3 10*3/uL — ABNORMAL HIGH (ref 0.7–3.1)
Lymphs: 30 %
MCH: 29.8 pg (ref 26.6–33.0)
MCHC: 33.8 g/dL (ref 31.5–35.7)
MCV: 88 fL (ref 79–97)
Monocytes Absolute: 1 10*3/uL — ABNORMAL HIGH (ref 0.1–0.9)
Monocytes: 10 %
Neutrophils Absolute: 6.4 10*3/uL (ref 1.4–7.0)
Neutrophils: 58 %
Platelets: 364 10*3/uL (ref 150–450)
RBC: 5.13 x10E6/uL (ref 3.77–5.28)
RDW: 12.6 % (ref 11.7–15.4)
WBC: 10.9 10*3/uL — ABNORMAL HIGH (ref 3.4–10.8)

## 2020-08-17 LAB — COMPREHENSIVE METABOLIC PANEL
ALT: 57 IU/L — ABNORMAL HIGH (ref 0–32)
AST: 31 IU/L (ref 0–40)
Albumin/Globulin Ratio: 1.7 (ref 1.2–2.2)
Albumin: 4.7 g/dL (ref 3.9–5.0)
Alkaline Phosphatase: 70 IU/L (ref 44–121)
BUN/Creatinine Ratio: 10 (ref 9–23)
BUN: 11 mg/dL (ref 6–20)
Bilirubin Total: 0.3 mg/dL (ref 0.0–1.2)
CO2: 23 mmol/L (ref 20–29)
Calcium: 10.1 mg/dL (ref 8.7–10.2)
Chloride: 101 mmol/L (ref 96–106)
Creatinine, Ser: 1.12 mg/dL — ABNORMAL HIGH (ref 0.57–1.00)
GFR calc Af Amer: 79 mL/min/{1.73_m2} (ref >59)
GFR calc non Af Amer: 69 mL/min/{1.73_m2} (ref >59)
Globulin, Total: 2.7 g/dL (ref 1.5–4.5)
Glucose: 81 mg/dL (ref 65–99)
Potassium: 4.9 mmol/L (ref 3.5–5.2)
Sodium: 138 mmol/L (ref 134–144)
Total Protein: 7.4 g/dL (ref 6.0–8.5)

## 2020-08-17 LAB — TSH: TSH: 0.697 u[IU]/mL (ref 0.450–4.500)

## 2020-08-17 LAB — BETA HCG QUANT (REF LAB): hCG Quant: <1 m[IU]/mL

## 2020-08-18 ENCOUNTER — Other Ambulatory Visit: Payer: Self-pay | Admitting: Physician Assistant

## 2020-08-18 DIAGNOSIS — M25511 Pain in right shoulder: Secondary | ICD-10-CM

## 2020-08-22 IMAGING — US US MFM OB TRANSVAGINAL
1 series · 14 of 23 positions shown · non-contrast
Comparison: none

[Series 1: us mfm ob transvaginal · 23 acquisitions, 14 frames shown]
[im 1/23]
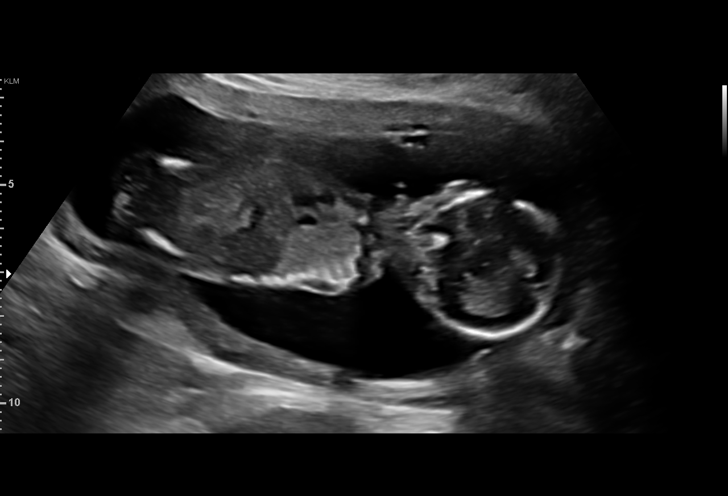
[im 3/23]
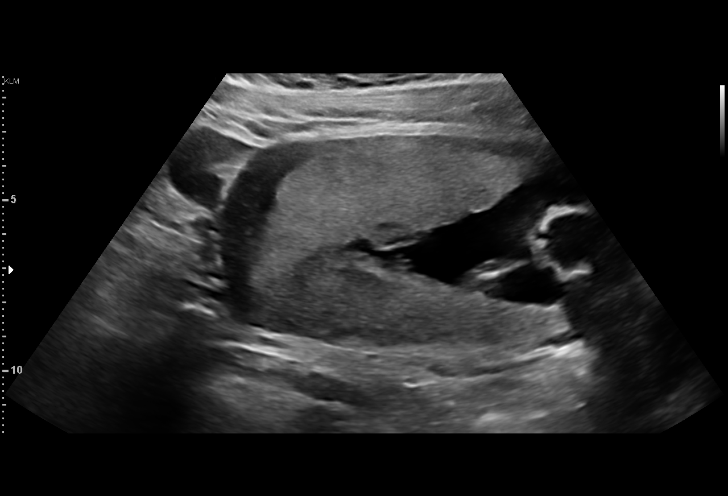
[im 5/23]
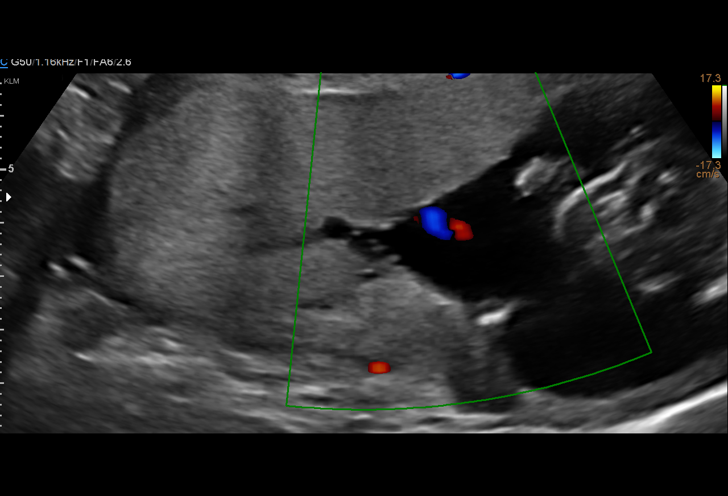
[im 6/23]
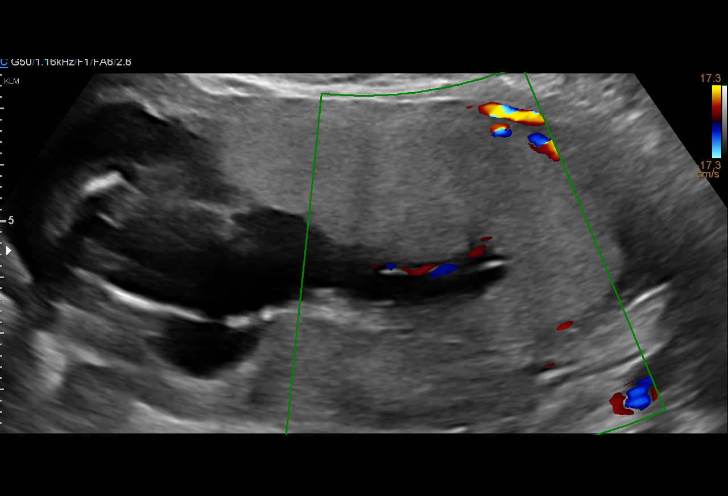
[im 8/23]
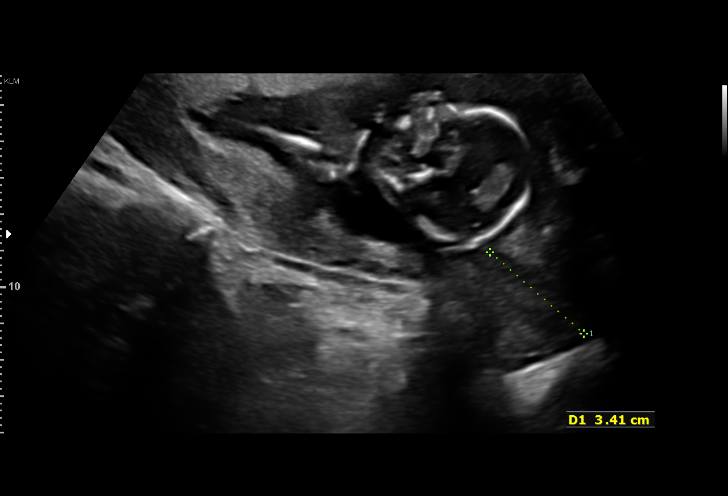
[im 10/23]
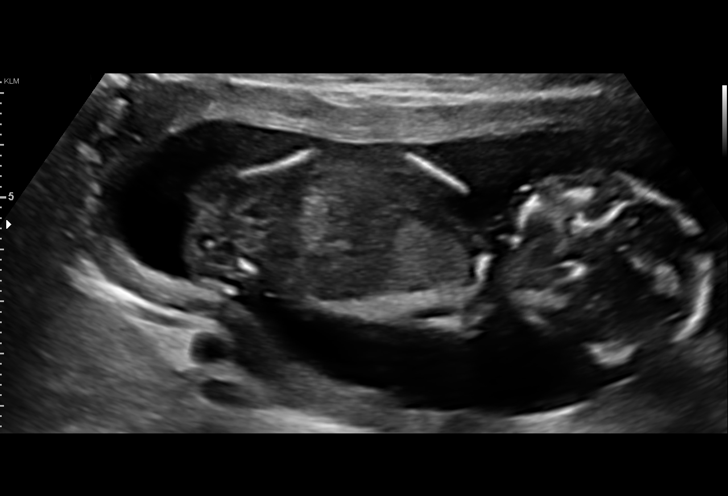
[im 11/23]
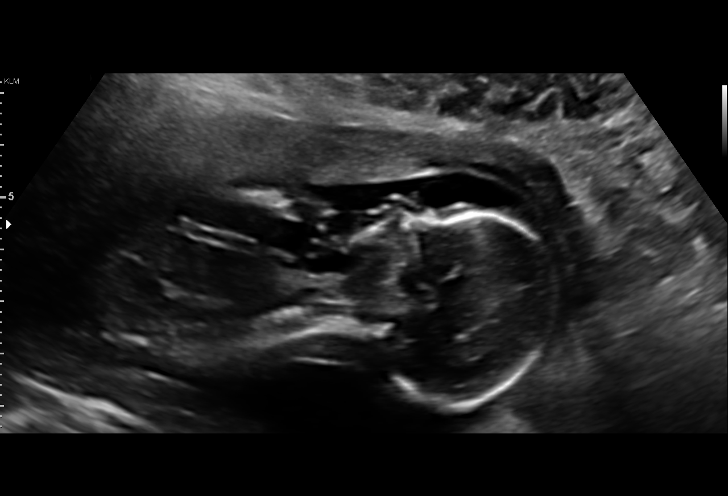
[im 13/23]
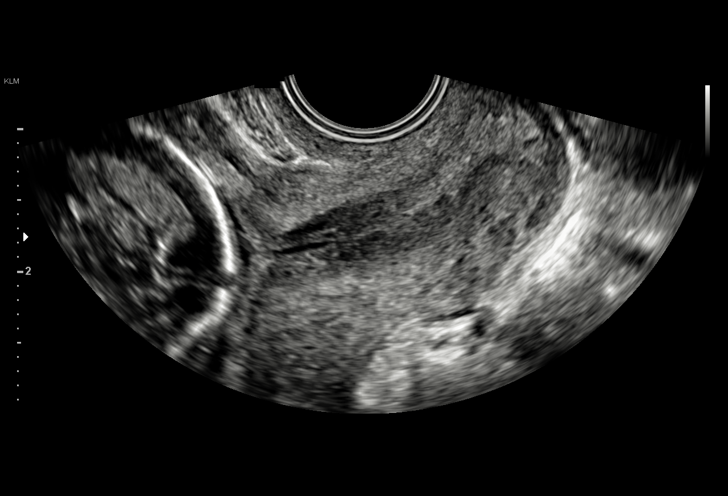
[im 14/23]
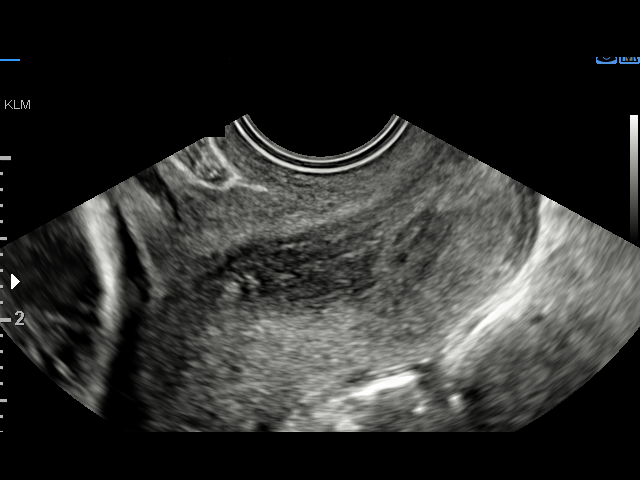
[im 16/23]
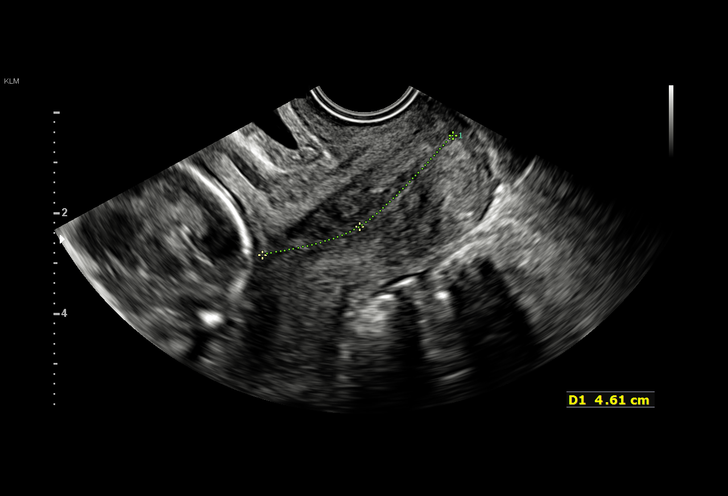
[im 18/23]
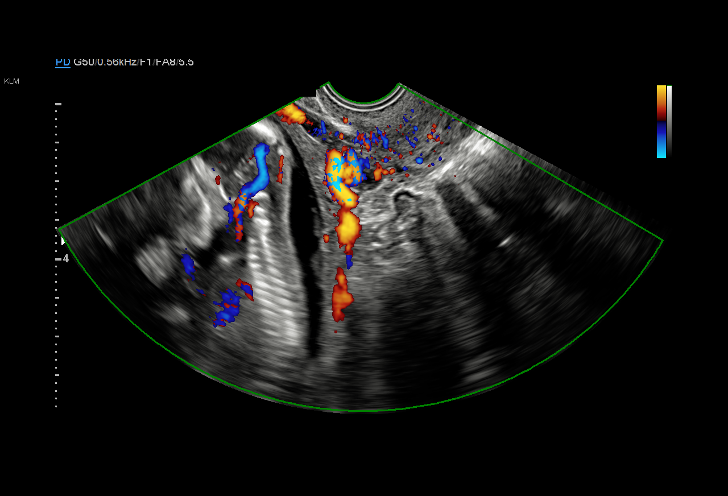
[im 19/23]
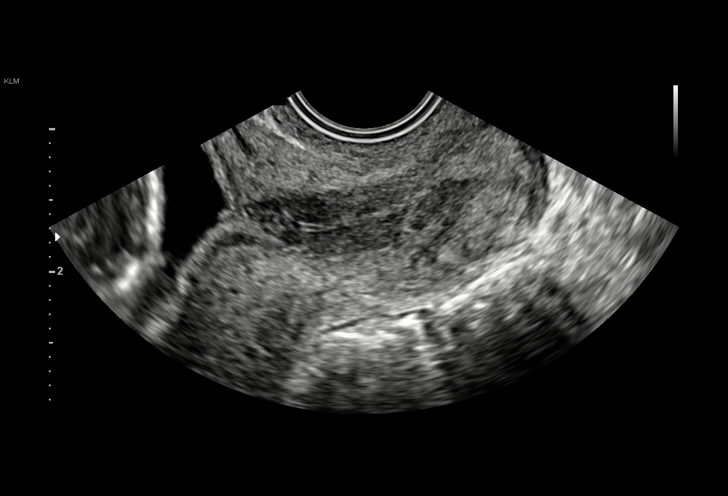
[im 21/23]
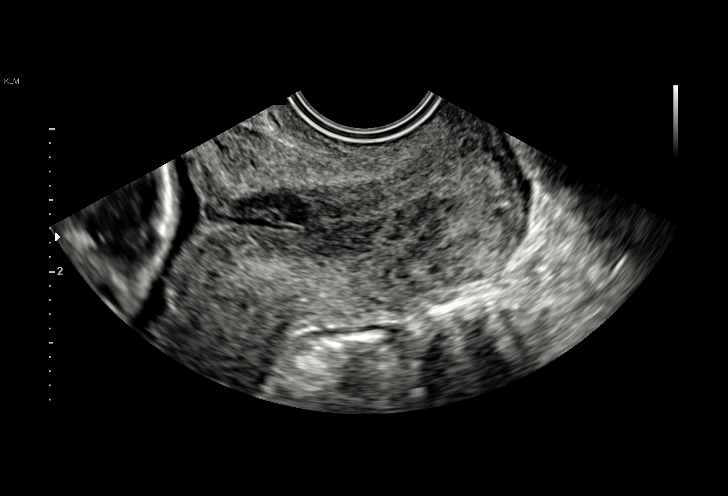
[im 23/23]
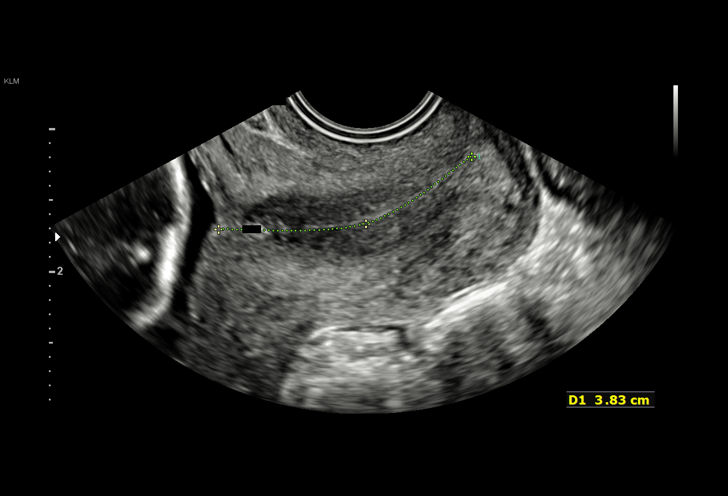

[14 of 23 positions shown; findings below may reference images not displayed]

OB/GYN &
                                                            [facility]

 ----------------------------------------------------------------------

 ----------------------------------------------------------------------
Indications

  Uterine abnormality during pregnancy
  (Bicornuate)
  Genetic carrier (Heterozygous for partial
  deletion of the Von Hippel Lindau gene)
  16 weeks gestation of pregnancy
 ----------------------------------------------------------------------
Vital Signs

                                                Height:        5'8"
Fetal Evaluation

 Num Of Fetuses:         1
 Fetal Heart Rate(bpm):  141
 Cardiac Activity:       Observed
 Presentation:           Cephalic
 Placenta:               Anterior
 P. Cord Insertion:      Visualized

 Amniotic Fluid
 AFI FV:      Within normal limits
OB History

 Gravidity:    2         Term:   0        Prem:   0        SAB:   1
 TOP:          0       Ectopic:  0        Living: 0
Gestational Age
 LMP:           16w 1d        Date:  07/26/19                 EDD:   05/01/20
 Best:          16w 1d     Det. By:  LMP  (07/26/19)          EDD:   05/01/20
Anatomy

 Stomach:               Appears normal, left   Bladder:                Appears normal
                        sided
Cervix Uterus Adnexa

 Cervix
 Length:            3.8  cm.
 Normal appearance by transvaginal scan

 Uterus
 Bicornuate.

 Adnexa
 No abnormality visualized.
Comments

 This patient was seen for a transvaginal cervical length
 measurement due to a history of a bicornuate uterus.  She
 also has a history of the von Hippel-Lindau disease.  She
 denies any problems since her last exam.

 On today's exam, a viable singleton intrauterine pregnancy
 was noted with normal amniotic fluid.

 On a transvaginal ultrasound performed today, her cervical
 length measured 3.8 cm long without any signs of funneling.
 She was reassured that based on today's cervical length
 measurement, her risk of a preterm birth is low at this time.

 Due to her history of of a bicornuate uterus, we will continue
 to follow her with serial growth ultrasounds throughout her
 pregnancy.

 Due to her history of the von Hippel-Lindau disease, an MRI
 of the brain should be obtained should she complain of a
 persistent headache or should she complain of visual
 changes.

 A fetal anatomy scan and cervical length measurement was
 scheduled in 4 weeks.

## 2020-09-04 ENCOUNTER — Telehealth: Payer: Self-pay

## 2020-09-04 NOTE — Telephone Encounter (Signed)
Patient scheduled an appointment.

## 2020-09-05 ENCOUNTER — Encounter: Payer: Self-pay | Admitting: Nurse Practitioner

## 2020-09-05 ENCOUNTER — Other Ambulatory Visit: Payer: Self-pay | Admitting: Infectious Diseases

## 2020-09-05 ENCOUNTER — Ambulatory Visit (HOSPITAL_COMMUNITY)
Admission: RE | Admit: 2020-09-05 | Discharge: 2020-09-05 | Disposition: A | Discharge status: Home / Self Care | Payer: No Typology Code available for payment source | Source: Ambulatory Visit | Attending: Pulmonary Disease | Admitting: Pulmonary Disease

## 2020-09-05 ENCOUNTER — Telehealth (INDEPENDENT_AMBULATORY_CARE_PROVIDER_SITE_OTHER): Payer: No Typology Code available for payment source | Admitting: Nurse Practitioner

## 2020-09-05 ENCOUNTER — Telehealth: Payer: Self-pay | Admitting: Infectious Diseases

## 2020-09-05 VITALS — Ht 68.0 in | Wt 203.0 lb

## 2020-09-05 DIAGNOSIS — Z20822 Contact with and (suspected) exposure to covid-19: Secondary | ICD-10-CM | POA: Diagnosis not present

## 2020-09-05 DIAGNOSIS — Z6831 Body mass index (BMI) 31.0-31.9, adult: Secondary | ICD-10-CM

## 2020-09-05 DIAGNOSIS — U071 COVID-19: Secondary | ICD-10-CM | POA: Diagnosis not present

## 2020-09-05 DIAGNOSIS — Z789 Other specified health status: Secondary | ICD-10-CM | POA: Diagnosis not present

## 2020-09-05 DIAGNOSIS — J019 Acute sinusitis, unspecified: Secondary | ICD-10-CM

## 2020-09-05 DIAGNOSIS — Z6830 Body mass index (BMI) 30.0-30.9, adult: Secondary | ICD-10-CM

## 2020-09-05 LAB — POC COVID19 BINAXNOW: SARS Coronavirus 2 Ag: POSITIVE — AB

## 2020-09-05 MED ORDER — ALBUTEROL SULFATE HFA 108 (90 BASE) MCG/ACT IN AERS: 2.0000 | INHALATION_SPRAY | Freq: Once | RESPIRATORY_TRACT | Status: DC | PRN

## 2020-09-05 MED ORDER — EPINEPHRINE 0.3 MG/0.3ML IJ SOAJ: 0.3000 mg | Freq: Once | INTRAMUSCULAR | Status: DC | PRN

## 2020-09-05 MED ORDER — SODIUM CHLORIDE 0.9 % IV SOLN: INTRAVENOUS | Status: DC | PRN

## 2020-09-05 MED ORDER — DIPHENHYDRAMINE HCL 50 MG/ML IJ SOLN: 50.0000 mg | Freq: Once | INTRAMUSCULAR | Status: DC | PRN

## 2020-09-05 MED ORDER — SODIUM CHLORIDE 0.9 % IV SOLN: Freq: Once | INTRAVENOUS | Status: AC

## 2020-09-05 MED ORDER — FAMOTIDINE IN NACL 20-0.9 MG/50ML-% IV SOLN: 20.0000 mg | Freq: Once | INTRAVENOUS | Status: DC | PRN

## 2020-09-05 MED ORDER — METHYLPREDNISOLONE SODIUM SUCC 125 MG IJ SOLR: 125.0000 mg | Freq: Once | INTRAMUSCULAR | Status: DC | PRN

## 2020-09-05 MED ORDER — AZITHROMYCIN 250 MG PO TABS: ORAL_TABLET | ORAL | 0 refills | Status: DC

## 2020-09-05 NOTE — Telephone Encounter (Signed)
Called to Discuss with patient about Covid symptoms and the use of the monoclonal antibody infusion for those with mild to moderate Covid symptoms and at a high risk of hospitalization.     Pt appears to qualify for this infusion due to co-morbid conditions and/or a member of an at-risk group in accordance with the FDA Emergency Use Authorization.     Symptom onset: 08/30/20 Vaccinated: no  Qualified for Infusion: BMI > 30    Nicole Alberts, MSN, NP-C [facility]  [email or web address] Pager: [phone] Office: [phone] [facility] Main Line: [phone]

## 2020-09-05 NOTE — Discharge Instructions (Signed)
10 Things You Can Do to Manage Your COVID-19 Symptoms at Home If you have possible or confirmed COVID-19: 1. Stay home from work and school. And stay away from other public places. If you must go out, avoid using any kind of public transportation, ridesharing, or taxis. 2. Monitor your symptoms carefully. If your symptoms get worse, call your healthcare provider immediately. 3. Get rest and stay hydrated. 4. If you have a medical appointment, call the healthcare provider ahead of time and tell them that you have or may have COVID-19. 5. For medical emergencies, call 911 and notify the dispatch personnel that you have or may have COVID-19. 6. Cover your cough and sneezes with a tissue or use the inside of your elbow. 7. Wash your hands often with soap and water for at least 20 seconds or clean your hands with an alcohol-based hand sanitizer that contains at least 60% alcohol. 8. As much as possible, stay in a specific room and away from other people in your home. Also, you should use a separate bathroom, if available. If you need to be around other people in or outside of the home, wear a mask. 9. Avoid sharing personal items with other people in your household, like dishes, towels, and bedding. 10. Clean all surfaces that are touched often, like counters, tabletops, and doorknobs. Use household cleaning sprays or wipes according to the label instructions. cdc.gov/coronavirus 03/10/2019 This information is not intended to replace advice given to you by your health care provider. Make sure you discuss any questions you have with your health care provider. Document Revised: 08/12/2019 Document Reviewed: 08/12/2019 Elsevier Patient Education  2020 Elsevier Inc. What types of side effects do monoclonal antibody drugs cause?  Common side effects  In general, the more common side effects caused by monoclonal antibody drugs include: . Allergic reactions, such as hives or itching . Flu-like signs and  symptoms, including chills, fatigue, fever, and muscle aches and pains . Nausea, vomiting . Diarrhea . Skin rashes . Low blood pressure   The CDC is recommending patients who receive monoclonal antibody treatments wait at least 90 days before being vaccinated.  Currently, there are no data on the safety and efficacy of mRNA COVID-19 vaccines in persons who received monoclonal antibodies or convalescent plasma as part of COVID-19 treatment. Based on the estimated half-life of such therapies as well as evidence suggesting that reinfection is uncommon in the 90 days after initial infection, vaccination should be deferred for at least 90 days, as a precautionary measure until additional information becomes available, to avoid interference of the antibody treatment with vaccine-induced immune responses. If you have any questions or concerns after the infusion please call the Advanced Practice Provider on call at [phone]. This number is ONLY intended for your use regarding questions or concerns about the infusion post-treatment side-effects.  Please do not provide this number to others for use. For return to work notes please contact your primary care provider.   If someone you know is interested in receiving treatment please have them call the COVID hotline at [phone].   

## 2020-09-05 NOTE — Progress Notes (Signed)
Virtual Visit via Telephone Note   This visit type was conducted due to national recommendations for restrictions regarding the COVID-19 Pandemic (e.g. social distancing) in an effort to limit this patient's exposure and mitigate transmission in our community.  Due to her co-morbid illnesses, this patient is at least at moderate risk for complications without adequate follow up.  This format is felt to be most appropriate for this patient at this time.  The patient did not have access to video technology/had technical difficulties with video requiring transitioning to audio format only (telephone).  All issues noted in this document were discussed and addressed.  No physical exam could be performed with this format.  Patient verbally consented to a telehealth visit.   Date:  09/05/2020   ID:  Nicole Chase, DOB Oct 12, 1995, MRN [id number]  Patient Location: Home Provider Location: Office/Clinic  PCP:  Marianne Sofia, PA-C   Evaluation Performed:  Follow-Up Visit  Chief Complaint:  Sinus congestion  History of Present Illness:    Nicole Chase is a 24 y.o. female with sinus congestion, pressure, and pain. She also complains of bilateral ear pain, fatigue, sore throat and body aches. She states she has a productive cough with tan mucus. Onset of symptoms was 7-days ago.Treatment has included Tylenol, Motrin, and Sudafed OTC. Her young daughter had positive COVID-19 test at pediatrician's office yesterday. Pt had a positive home COVID-19 test. She has not received COVID-19 vaccines. She tells me she is employed at [employer] of the [place] and has close contact with patients.    The patient does have symptoms concerning for COVID-19 infection (fever, chills, cough, or new shortness of breath).    Past Medical History:  Diagnosis Date   Exertional dyspnea 12/29/2019   Gestational hypertension 12/29/2019   Hearing loss    Palpitations 12/29/2019   Pancreatic cyst    Pre-eclampsia     Pregnancy induced hypertension    VHL (von Hippel-Lindau syndrome) (HCC)     Past Surgical History:  Procedure Laterality Date   WISDOM TOOTH EXTRACTION      Family History  Problem Relation Age of Onset   Healthy Mother    Von Hippel-Lindau syndrome Mother    Hearing loss Mother    Healthy Father    Breast cancer Paternal Grandmother    Von Hippel-Lindau syndrome Maternal Uncle    Von Hippel-Lindau syndrome Maternal Grandmother     Social History   Socioeconomic History   Marital status: Single    Spouse name: Not on file   Number of children: 1   Years of education: Not on file   Highest education level: Not on file  Occupational History   Occupation: RN  Tobacco Use   Smoking status: Never Smoker   Smokeless tobacco: Never Used  Building services engineer Use: Never used  Substance and Sexual Activity   Alcohol use: Yes    Alcohol/week: 3.0 standard drinks    Types: 3 Glasses of wine per week    Comment: ocassionally   Drug use: No   Sexual activity: Yes    Partners: Male    Birth control/protection: None  Other Topics Concern   Not on file  Social History Narrative   Not on file   Social Determinants of Health   Financial Resource Strain: Not on file  Food Insecurity: Not on file  Transportation Needs: Not on file  Physical Activity: Not on file  Stress: Not on file  Social Connections: Not on  file  Intimate Partner Violence: Not on file    Outpatient Medications Prior to Visit  Medication Sig Dispense Refill   ADDERALL XR 20 MG 24 hr capsule Take 20 mg by mouth daily.     ALPRAZolam (XANAX) 0.25 MG tablet Take 0.25-0.5 mg by mouth daily as needed.     amphetamine-dextroamphetamine (ADDERALL) 20 MG tablet Take 20 mg by mouth daily.     buPROPion (WELLBUTRIN XL) 150 MG 24 hr tablet Take 150 mg by mouth daily.     escitalopram (LEXAPRO) 10 MG tablet Take 10 mg by mouth daily.     No facility-administered medications prior  to visit.    Allergies:   Cephalexin   Social History   Tobacco Use   Smoking status: Never Smoker   Smokeless tobacco: Never Used  Vaping Use   Vaping Use: Never used  Substance Use Topics   Alcohol use: Yes    Alcohol/week: 3.0 standard drinks    Types: 3 Glasses of wine per week    Comment: ocassionally   Drug use: No     Review of Systems  Constitutional: Positive for fever and malaise/fatigue. Negative for chills.  HENT: Positive for congestion, ear pain, sinus pain and sore throat.   Respiratory: Positive for cough and sputum production. Negative for shortness of breath and wheezing.   Cardiovascular: Negative for chest pain.  Gastrointestinal: Negative for abdominal pain, constipation, diarrhea, nausea and vomiting.  Genitourinary: Negative for frequency and urgency.  Musculoskeletal: Positive for joint pain and myalgias. Negative for back pain.  Neurological: Positive for headaches. Negative for dizziness.     Labs/Other Tests and Data Reviewed:    Recent Labs: 08/16/2020: ALT 57; BUN 11; Creatinine, Ser 1.12; Hemoglobin 15.3; Platelets 364; Potassium 4.9; Sodium 138; TSH 0.697   Recent Lipid Panel No results found for: CHOL, TRIG, HDL, CHOLHDL, LDLCALC, LDLDIRECT  Wt Readings from Last 3 Encounters:  09/05/20 203 lb (92.1 kg)  08/16/20 203 lb 3.2 oz (92.2 kg)  07/05/20 207 lb (93.9 kg)     Objective:    Vital Signs:  Ht 5\' 8"  (1.727 m)    Wt 203 lb (92.1 kg)    BMI 30.87 kg/m    Physical Exam No physical exam performed due to telemedicine visit  ASSESSMENT & PLAN:    1. Lab test positive for detection of COVID-19 virus -Rest and push fluids -Continue symptomatic treatment with Tylenol, Motrin, and Sudafed  2. Close exposure to COVID-19 virus - POC COVID-19  3. Acute non-recurrent sinusitis, unspecified location - azithromycin (ZITHROMAX) 250 MG tablet; Take two tablets by mouth on day one, take one tablet by mouth days two-five  Dispense: 6  tablet; Refill: 0  4. BMI 30.0-30.9,adult -Referral for MAB sent  5. Works in healthcare facility -Referral for [place] sent  Pt notified of positive COVID-19 lab results. She is awaiting contact from [place] infusion clinic. Pt encouraged to seek emergency medical treatment if symptoms worsen.   COVID-19 Education: The signs and symptoms of COVID-19 were discussed with the patient and how to seek care for testing (follow up with PCP or arrange E-visit). The importance of social distancing was discussed today.  Telephone call: 10:24-10:30  I spent 20 minutes dedicated to the care of this patient on the date of this encounter to include face-to-face time with the patient, as well as:EMR review, MAB infusion coordination, and prescription drug management  Follow Up:  Virtual Visit  prn  Signed,  Jerrell Belfast, DNP  09/05/2020 [place]

## 2020-09-05 NOTE — Progress Notes (Signed)
  Diagnosis: COVID-19  Physician:  Dr. Shan Levans  Procedure: Covid Infusion Clinic Med: casirivimab\imdevimab infusion - Provided patient with casirivimab\imdevimab fact sheet for patients, parents and caregivers prior to infusion.  Complications: No immediate complications noted.  Discharge: Discharged home   Gregary Signs 09/05/2020

## 2020-09-05 NOTE — Progress Notes (Signed)
Patient reviewed Fact Sheet for Patients, Parents, and Caregivers for Emergency Use Authorization (EUA) of casirivimab/imdevimab for the Treatment of Coronavirus.  Patient indicates understanding that casirivimab/imdevimab may not be effective to treat the Omicron variant. Patient also reviewed and is agreeable to the estimated cost of treatment.  Patient is agreeable to proceed.

## 2020-09-05 NOTE — Progress Notes (Signed)
I connected by phone with Nicole Chase on 09/05/2020 at 12:33 PM to discuss the potential use of a new treatment for mild to moderate COVID-19 viral infection in non-hospitalized patients.  This patient is a 24 y.o. female that meets the FDA criteria for Emergency Use Authorization of COVID monoclonal antibody casirivimab/imdevimab, bamlanivimab/etesevimab, or sotrovimab.  Has a (+) direct SARS-CoV-2 viral test result  Has mild or moderate COVID-19   Is NOT hospitalized due to COVID-19  Is within 10 days of symptom onset  Has at least one of the high risk factor(s) for progression to severe COVID-19 and/or hospitalization as defined in EUA.  Specific high risk criteria : BMI > 25 and Other high risk medical condition per CDC:  unvaccinated    I have spoken and communicated the following to the patient or parent/caregiver regarding COVID monoclonal antibody treatment:  1. FDA has authorized the emergency use for the treatment of mild to moderate COVID-19 in adults and pediatric patients with positive results of direct SARS-CoV-2 viral testing who are 44 years of age and older weighing at least 40 kg, and who are at high risk for progressing to severe COVID-19 and/or hospitalization.  2. The significant known and potential risks and benefits of COVID monoclonal antibody, and the extent to which such potential risks and benefits are unknown.  3. Information on available alternative treatments and the risks and benefits of those alternatives, including clinical trials.  4. Patients treated with COVID monoclonal antibody should continue to self-isolate and use infection control measures (e.g., wear mask, isolate, social distance, avoid sharing personal items, clean and disinfect "high touch" surfaces, and frequent handwashing) according to CDC guidelines.   5. The patient or parent/caregiver has the option to accept or refuse COVID monoclonal antibody treatment.  After reviewing this  information with the patient, the patient has agreed to receive one of the available covid 19 monoclonal antibodies and will be provided an appropriate fact sheet prior to infusion. [name], NP 09/05/2020 12:33 PM

## 2020-11-08 MED FILL — ALPRAZolam 0.25 MG TABS: 0.25 | 30 days supply | Qty: 60 | Fill #1

## 2020-11-30 ENCOUNTER — Other Ambulatory Visit (HOSPITAL_BASED_OUTPATIENT_CLINIC_OR_DEPARTMENT_OTHER): Payer: Self-pay

## 2020-12-01 ENCOUNTER — Other Ambulatory Visit (HOSPITAL_BASED_OUTPATIENT_CLINIC_OR_DEPARTMENT_OTHER): Payer: Self-pay

## 2021-03-02 ENCOUNTER — Other Ambulatory Visit (HOSPITAL_COMMUNITY): Payer: Self-pay

## 2021-03-19 ENCOUNTER — Other Ambulatory Visit (HOSPITAL_COMMUNITY): Payer: Self-pay

## 2021-03-19 ENCOUNTER — Other Ambulatory Visit: Payer: Self-pay

## 2021-03-20 ENCOUNTER — Other Ambulatory Visit (HOSPITAL_COMMUNITY): Payer: Self-pay

## 2021-03-20 MED ORDER — AMPHETAMINE-DEXTROAMPHETAMINE 20 MG PO TABS
ORAL_TABLET | ORAL | 0 refills | Status: DC
  Filled 2021-03-20: qty 30, 30d supply, fill #0

## 2021-03-20 MED ORDER — AMPHETAMINE-DEXTROAMPHET ER 20 MG PO CP24
ORAL_CAPSULE | ORAL | 0 refills | Status: DC
Start: 2021-03-20 — End: 2021-04-26
  Filled 2021-03-20: qty 30, 30d supply, fill #0

## 2021-03-21 ENCOUNTER — Other Ambulatory Visit (HOSPITAL_COMMUNITY): Payer: Self-pay

## 2021-03-21 ENCOUNTER — Other Ambulatory Visit: Payer: Self-pay

## 2021-03-22 ENCOUNTER — Other Ambulatory Visit (HOSPITAL_COMMUNITY): Payer: Self-pay

## 2021-03-22 MED ORDER — GABAPENTIN 600 MG PO TABS
ORAL_TABLET | ORAL | 1 refills | Status: DC
  Filled 2021-03-22: qty 120, 30d supply, fill #0

## 2021-04-26 ENCOUNTER — Other Ambulatory Visit (HOSPITAL_COMMUNITY): Payer: Self-pay

## 2021-04-26 MED ORDER — AMPHETAMINE-DEXTROAMPHETAMINE 20 MG PO TABS
ORAL_TABLET | ORAL | 0 refills | Status: DC
  Filled 2021-04-26: qty 60, 60d supply, fill #0

## 2021-04-26 MED ORDER — AMPHETAMINE-DEXTROAMPHET ER 20 MG PO CP24
ORAL_CAPSULE | ORAL | 0 refills | Status: DC
  Filled 2021-04-26: qty 90, 90d supply, fill #0

## 2021-04-26 MED ORDER — ALPRAZOLAM 0.25 MG PO TABS
ORAL_TABLET | ORAL | 2 refills | Status: DC
  Filled 2021-04-26: qty 60, 30d supply, fill #0

## 2021-04-26 MED ORDER — ESCITALOPRAM OXALATE 10 MG PO TABS
5.0000 mg | ORAL_TABLET | Freq: Every day | ORAL | 4 refills | Status: DC
  Filled 2021-04-26: qty 45, 90d supply, fill #0

## 2021-05-16 ENCOUNTER — Encounter: Payer: Self-pay | Admitting: Physician Assistant

## 2021-05-16 ENCOUNTER — Ambulatory Visit (INDEPENDENT_AMBULATORY_CARE_PROVIDER_SITE_OTHER): Payer: No Typology Code available for payment source | Admitting: Physician Assistant

## 2021-05-16 VITALS — BP 118/64 | HR 64 | Temp 97.2°F | Ht 67.0 in | Wt 200.0 lb

## 2021-05-16 DIAGNOSIS — J029 Acute pharyngitis, unspecified: Secondary | ICD-10-CM

## 2021-05-16 DIAGNOSIS — J020 Streptococcal pharyngitis: Secondary | ICD-10-CM

## 2021-05-16 LAB — POC COVID19 BINAXNOW: SARS Coronavirus 2 Ag: NEGATIVE

## 2021-05-16 LAB — POCT RAPID STREP A (OFFICE): Rapid Strep A Screen: POSITIVE — AB

## 2021-05-16 MED ORDER — AZITHROMYCIN 250 MG PO TABS: ORAL_TABLET | ORAL | 0 refills | Status: AC

## 2021-05-16 NOTE — Progress Notes (Signed)
Acute Office Visit  Subjective:    Patient ID: Nicole Chase, female    DOB: December 19, 1995, 25 y.o.   MRN: [id number]  Chief Complaint  Patient presents with   Sore Throat    X 4 days, gargling salt water helps some    HPI Patient is in today for complaints of sore throat and malaise for past several days - denies headache or fever Minimal congestion and no cough  Past Medical History:  Diagnosis Date   Exertional dyspnea 12/29/2019   Gestational hypertension 12/29/2019   Hearing loss    Palpitations 12/29/2019   Pancreatic cyst    Pre-eclampsia    Pregnancy induced hypertension    VHL (von Hippel-Lindau syndrome) ([place])     Past Surgical History:  Procedure Laterality Date   WISDOM TOOTH EXTRACTION      Family History  Problem Relation Age of Onset   Healthy Mother    Von Hippel-Lindau syndrome Mother    Hearing loss Mother    Healthy Father    Breast cancer Paternal Grandmother    Von Hippel-Lindau syndrome Maternal Uncle    Von Hippel-Lindau syndrome Maternal Grandmother     Social History   Socioeconomic History   Marital status: Single    Spouse name: Not on file   Number of children: 1   Years of education: Not on file   Highest education level: Not on file  Occupational History   Occupation: RN  Tobacco Use   Smoking status: Never   Smokeless tobacco: Never  Vaping Use   Vaping Use: Never used  Substance and Sexual Activity   Alcohol use: Yes    Alcohol/week: 3.0 standard drinks    Types: 3 Glasses of wine per week    Comment: ocassionally   Drug use: No   Sexual activity: Yes    Partners: Male    Birth control/protection: None  Other Topics Concern   Not on file  Social History Narrative   Not on file   Social Determinants of Health   Financial Resource Strain: Not on file  Food Insecurity: Not on file  Transportation Needs: Not on file  Physical Activity: Not on file  Stress: Not on file  Social Connections: Not on file  Intimate  Partner Violence: Not on file    Outpatient Medications Prior to Visit  Medication Sig Dispense Refill   amphetamine-dextroamphetamine (ADDERALL XR) 20 MG 24 hr capsule TAKE 1 CAPSULE BY MOUTH DAILY 90 capsule 0   ADDERALL XR 20 MG 24 hr capsule Take 20 mg by mouth daily.     ALPRAZolam (XANAX) 0.25 MG tablet Take 0.25-0.5 mg by mouth daily as needed.     ALPRAZolam (XANAX) 0.25 MG tablet TAKE 1-2 TABLETS BY MOUTH DAILY AS NEEDED 60 tablet 2   amphetamine-dextroamphetamine (ADDERALL XR) 20 MG 24 hr capsule TAKE 1 CAPSULE BY MOUTH DAILY 90 capsule 0   amphetamine-dextroamphetamine (ADDERALL) 20 MG tablet Take 20 mg by mouth daily.     amphetamine-dextroamphetamine (ADDERALL) 20 MG tablet TAKE 1 TABLET BY MOUTH ONCE A DAY 90 tablet 0   amphetamine-dextroamphetamine (ADDERALL) 20 MG tablet TAKE 1 TABLET BY MOUTH DAILY 60 tablet 0   azithromycin (ZITHROMAX) 250 MG tablet Take two tablets by mouth on day one, take one tablet by mouth days two-five 6 tablet 0   buPROPion (WELLBUTRIN XL) 150 MG 24 hr tablet Take 150 mg by mouth daily.     buPROPion (WELLBUTRIN XL) 300 MG 24 hr tablet  TAKE 1 TABLET BY MOUTH EVERY MORNING 90 tablet 4   escitalopram (LEXAPRO) 10 MG tablet Take 10 mg by mouth daily.     escitalopram (LEXAPRO) 10 MG tablet TAKE 1 TABLET BY MOUTH ONCE A DAY 90 tablet 4   escitalopram (LEXAPRO) 10 MG tablet TAKE 1/2 TABLET BY MOUTH DAILY 45 tablet 4   gabapentin (NEURONTIN) 600 MG tablet Take 1 tablet by mouth 4 times daily 120 tablet 1   No facility-administered medications prior to visit.    Allergies  Allergen Reactions   Cephalexin Hives    Review of Systems CONSTITUTIONAL: Negative for chills, fatigue, fever, unintentional weight gain and unintentional weight loss.  E/N/T: see HPI CARDIOVASCULAR: Negative for chest pain, dizziness, palpitations and pedal edema.  RESPIRATORY: Negative for recent cough and dyspnea.  GASTROINTESTINAL: Negative for abdominal pain, acid reflux  symptoms, constipation, diarrhea, nausea and vomiting.       Objective:    Physical Exam PHYSICAL EXAM:   VS: BP 118/64   Pulse 64   Temp (!) 97.2 F (36.2 C)   Ht '5\' 7"'$  (1.702 m)   Wt 200 lb (90.7 kg)   LMP 04/18/2021   SpO2 99%   BMI 31.32 kg/m   GEN: Well nourished, well developed, in no acute distress  HEENT: normal external ears and nose - normal external auditory canals and TMS -  - Lips, Teeth and Gums - normal  Oropharynx - moderate erythema and exudate Cardiac: RRR; no murmurs, rubs, or gallops,no edema  Respiratory:  normal respiratory rate and pattern with no distress - normal breath sounds with no rales, rhonchi, wheezes or rubs  Office Visit on 05/16/2021  Component Date Value Ref Range Status   Rapid Strep A Screen 05/16/2021 Positive (A) Negative Final   SARS Coronavirus 2 Ag 05/16/2021 Negative  Negative Final     BP 118/64   Pulse 64   Temp (!) 97.2 F (36.2 C)   Ht '5\' 7"'$  (1.702 m)   Wt 200 lb (90.7 kg)   LMP 04/18/2021   SpO2 99%   BMI 31.32 kg/m  Wt Readings from Last 3 Encounters:  05/16/21 200 lb (90.7 kg)  09/05/20 203 lb (92.1 kg)  08/16/20 203 lb 3.2 oz (92.2 kg)    Health Maintenance Due  Topic Date Due   HPV VACCINES (1 - 2-dose series) Never done   Hepatitis C Screening  Never done   TETANUS/TDAP  Never done   PAP-Cervical Cytology Screening  Never done   PAP SMEAR-Modifier  Never done       Topic Date Due   HPV VACCINES (1 - 2-dose series) Never done     Lab Results  Component Value Date   TSH 0.697 08/16/2020   Lab Results  Component Value Date   WBC 10.9 (H) 08/16/2020   HGB 15.3 08/16/2020   HCT 45.3 08/16/2020   MCV 88 08/16/2020   PLT 364 08/16/2020   Lab Results  Component Value Date   NA 138 08/16/2020   K 4.9 08/16/2020   CO2 23 08/16/2020   GLUCOSE 81 08/16/2020   BUN 11 08/16/2020   CREATININE 1.12 (H) 08/16/2020   BILITOT 0.3 08/16/2020   ALKPHOS 70 08/16/2020   AST 31 08/16/2020   ALT 57 (H)  08/16/2020   PROT 7.4 08/16/2020   ALBUMIN 4.7 08/16/2020   CALCIUM 10.1 08/16/2020   ANIONGAP 10 04/14/2020   No results found for: CHOL No results found for: HDL No results found  for: [place] No results found for: TRIG No results found for: CHOLHDL No results found for: HGBA1C     Assessment & Plan:  1. Sore throat - POCT rapid strep A - POC COVID-19 BinaxNow  2. Strep pharyngitis - azithromycin (ZITHROMAX) 250 MG tablet; Take 2 tablets on day 1, then 1 tablet daily on days 2 through 5  Dispense: 6 tablet; Refill: 0    Meds ordered this encounter  Medications   azithromycin (ZITHROMAX) 250 MG tablet    Sig: Take 2 tablets on day 1, then 1 tablet daily on days 2 through 5    Dispense:  6 tablet    Refill:  0    Order Specific Question:   Supervising Provider    AnswerShelton Silvas    Orders Placed This Encounter  Procedures   POCT rapid strep A   POC COVID-19 BinaxNow     Follow-up: Return if symptoms worsen or fail to improve.  An After Visit Summary was printed and given to the patient.  Yetta Flock [facility] [phone]

## 2021-08-08 ENCOUNTER — Ambulatory Visit: Payer: BC Managed Care – PPO | Admitting: Physician Assistant

## 2021-08-08 ENCOUNTER — Encounter: Payer: Self-pay | Admitting: Physician Assistant

## 2021-08-08 VITALS — BP 110/70 | HR 90 | Temp 97.2°F | Ht 67.0 in | Wt 198.9 lb

## 2021-08-08 DIAGNOSIS — J060 Acute laryngopharyngitis: Secondary | ICD-10-CM

## 2021-08-08 DIAGNOSIS — B070 Plantar wart: Secondary | ICD-10-CM

## 2021-08-08 LAB — POCT INFLUENZA A/B
Influenza A, POC: NEGATIVE
Influenza B, POC: NEGATIVE

## 2021-08-08 LAB — POC COVID19 BINAXNOW: SARS Coronavirus 2 Ag: NEGATIVE

## 2021-08-08 MED ORDER — AZITHROMYCIN 250 MG PO TABS: ORAL_TABLET | ORAL | 0 refills | Status: AC

## 2021-08-08 MED ORDER — TRIAMCINOLONE ACETONIDE 40 MG/ML IJ SUSP
60.0000 mg | Freq: Once | INTRAMUSCULAR | Status: AC
Start: 2021-08-08 — End: 2021-08-08
  Administered 2021-08-08: 60 mg via INTRAMUSCULAR

## 2021-08-08 NOTE — Progress Notes (Signed)
Acute Office Visit  Subjective:    Patient ID: Nicole Chase, female    DOB: 01/23/1996, 25 y.o.   MRN: [id number]  Chief Complaint  Patient presents with   Sore Throat   Ear Pain    HPI: Patient is in today for sore throat/ear pain and congestion for the past week -- denies fever, malaise Daughter with similar symptoms Feels like both ears stopped up as well  Pt has plantars wart and would like referral to dermatology  Past Medical History:  Diagnosis Date   Exertional dyspnea 12/29/2019   Gestational hypertension 12/29/2019   Hearing loss    Palpitations 12/29/2019   Pancreatic cyst    Pre-eclampsia    Pregnancy induced hypertension    VHL (von Hippel-Lindau syndrome)     Past Surgical History:  Procedure Laterality Date   WISDOM TOOTH EXTRACTION      Family History  Problem Relation Age of Onset   Healthy Mother    Von Hippel-Lindau syndrome Mother    Hearing loss Mother    Healthy Father    Breast cancer Paternal Grandmother    Von Hippel-Lindau syndrome Maternal Uncle    Von Hippel-Lindau syndrome Maternal Grandmother     Social History   Socioeconomic History   Marital status: Single    Spouse name: Not on file   Number of children: 1   Years of education: Not on file   Highest education level: Not on file  Occupational History   Occupation: RN  Tobacco Use   Smoking status: Never   Smokeless tobacco: Never  Vaping Use   Vaping Use: Never used  Substance and Sexual Activity   Alcohol use: Yes    Alcohol/week: 3.0 standard drinks    Types: 3 Glasses of wine per week    Comment: ocassionally   Drug use: No   Sexual activity: Yes    Partners: Male    Birth control/protection: None  Other Topics Concern   Not on file  Social History Narrative   Not on file   Social Determinants of Health   Financial Resource Strain: Not on file  Food Insecurity: Not on file  Transportation Needs: Not on file  Physical Activity: Not on file  Stress:  Not on file  Social Connections: Not on file  Intimate Partner Violence: Not on file    Outpatient Medications Prior to Visit  Medication Sig Dispense Refill   amphetamine-dextroamphetamine (ADDERALL XR) 20 MG 24 hr capsule TAKE 1 CAPSULE BY MOUTH DAILY 90 capsule 0   No facility-administered medications prior to visit.    Allergies  Allergen Reactions   Cephalexin Hives    Review of Systems CONSTITUTIONAL: Negative for chills, fatigue, fever, unintentional weight gain and unintentional weight loss.  E/N/T: see HPI CARDIOVASCULAR: Negative for chest pain, dizziness, palpitations and pedal edema.  RESPIRATORY: see HPI GASTROINTESTINAL: Negative for abdominal pain, acid reflux symptoms, constipation, diarrhea, nausea and vomiting.          Objective:    Physical Exam PHYSICAL EXAM:   VS: BP 110/70 (BP Location: Left Arm, Patient Position: Sitting, Cuff Size: Normal)   Pulse 90   Temp (!) 97.2 F (36.2 C) (Temporal)   Ht 5\' 7"  (1.702 m)   Wt 198 lb 14.4 oz (90.2 kg)   SpO2 98%   BMI 31.15 kg/m   GEN: Well nourished, well developed, in no acute distress  HEENT: TMS clear - canals clear Oropharynx - moderate erythema and tonsillar hypertrophy Cardiac:  RRR; no murmurs, Respiratory:  normal respiratory rate and pattern with no distress - normal breath sounds with no rales, rhonchi, wheezes or rubs Skin: right foot with plantars wart  Office Visit on 08/08/2021  Component Date Value Ref Range Status   SARS Coronavirus 2 Ag 08/08/2021 Negative  Negative Final   Influenza A, POC 08/08/2021 Negative  Negative Final   Influenza B, POC 08/08/2021 Negative  Negative Final    BP 110/70 (BP Location: Left Arm, Patient Position: Sitting, Cuff Size: Normal)   Pulse 90   Temp (!) 97.2 F (36.2 C) (Temporal)   Ht 5\' 7"  (1.702 m)   Wt 198 lb 14.4 oz (90.2 kg)   SpO2 98%   BMI 31.15 kg/m  Wt Readings from Last 3 Encounters:  08/08/21 198 lb 14.4 oz (90.2 kg)  05/16/21 200  lb (90.7 kg)  09/05/20 203 lb (92.1 kg)    Health Maintenance Due  Topic Date Due   HPV VACCINES (1 - 2-dose series) Never done   Hepatitis C Screening  Never done   TETANUS/TDAP  Never done   PAP-Cervical Cytology Screening  Never done   PAP SMEAR-Modifier  Never done       Topic Date Due   HPV VACCINES (1 - 2-dose series) Never done     Lab Results  Component Value Date   TSH 0.697 08/16/2020   Lab Results  Component Value Date   WBC 10.9 (H) 08/16/2020   HGB 15.3 08/16/2020   HCT 45.3 08/16/2020   MCV 88 08/16/2020   PLT 364 08/16/2020   Lab Results  Component Value Date   NA 138 08/16/2020   K 4.9 08/16/2020   CO2 23 08/16/2020   GLUCOSE 81 08/16/2020   BUN 11 08/16/2020   CREATININE 1.12 (H) 08/16/2020   BILITOT 0.3 08/16/2020   ALKPHOS 70 08/16/2020   AST 31 08/16/2020   ALT 57 (H) 08/16/2020   PROT 7.4 08/16/2020   ALBUMIN 4.7 08/16/2020   CALCIUM 10.1 08/16/2020   ANIONGAP 10 04/14/2020   No results found for: CHOL No results found for: HDL No results found for: LDLCALC No results found for: TRIG No results found for: CHOLHDL No results found for: HGBA1C     Assessment & Plan:   Problem List Items Addressed This Visit   None Visit Diagnoses     Acute laryngopharyngitis    -  Primary   Relevant Medications   triamcinolone acetonide (KENALOG-40) injection 60 mg (Start on 08/08/2021  9:30 AM)   azithromycin (ZITHROMAX) 250 MG tablet   Other Relevant Orders   POC COVID-19 BinaxNow (Completed)   POCT Influenza A/B (Completed)   Plantar wart       Relevant Medications   azithromycin (ZITHROMAX) 250 MG tablet   Other Relevant Orders   Ambulatory referral to Dermatology      Meds ordered this encounter  Medications   triamcinolone acetonide (KENALOG-40) injection 60 mg   azithromycin (ZITHROMAX) 250 MG tablet    Sig: Take 2 tablets on day 1, then 1 tablet daily on days 2 through 5    Dispense:  6 tablet    Refill:  0    Order  Specific Question:   Supervising Provider    AnswerRochel Brome [[id number]]    Orders Placed This Encounter  Procedures   Ambulatory referral to Dermatology   POC COVID-19 BinaxNow   POCT Influenza A/B     Follow-up: Return if symptoms worsen or  fail to improve.  An After Visit Summary was printed and given to the patient.  Yetta Flock [facility] [phone]

## 2021-08-14 ENCOUNTER — Other Ambulatory Visit (HOSPITAL_COMMUNITY): Payer: Self-pay

## 2021-08-14 MED ORDER — AMPHETAMINE-DEXTROAMPHET ER 20 MG PO CP24
20.0000 mg | ORAL_CAPSULE | Freq: Every day | ORAL | 0 refills | Status: DC
  Filled 2021-08-14: qty 90, 90d supply, fill #0

## 2021-08-14 MED ORDER — AMPHETAMINE-DEXTROAMPHETAMINE 20 MG PO TABS
20.0000 mg | ORAL_TABLET | Freq: Every day | ORAL | 0 refills | Status: DC
  Filled 2021-08-14: qty 60, 60d supply, fill #0

## 2021-08-15 ENCOUNTER — Other Ambulatory Visit (HOSPITAL_COMMUNITY): Payer: Self-pay

## 2021-10-05 DIAGNOSIS — L814 Other melanin hyperpigmentation: Secondary | ICD-10-CM | POA: Diagnosis not present

## 2021-10-05 DIAGNOSIS — Z85828 Personal history of other malignant neoplasm of skin: Secondary | ICD-10-CM | POA: Diagnosis not present

## 2021-10-05 DIAGNOSIS — L309 Dermatitis, unspecified: Secondary | ICD-10-CM | POA: Diagnosis not present

## 2021-10-05 DIAGNOSIS — B070 Plantar wart: Secondary | ICD-10-CM | POA: Diagnosis not present

## 2021-10-10 ENCOUNTER — Encounter: Payer: Self-pay | Admitting: Physician Assistant

## 2021-10-10 ENCOUNTER — Ambulatory Visit: Payer: BC Managed Care – PPO | Admitting: Physician Assistant

## 2021-10-10 ENCOUNTER — Other Ambulatory Visit: Payer: Self-pay

## 2021-10-10 VITALS — BP 120/78 | HR 76 | Temp 97.4°F | Ht 67.0 in | Wt 197.8 lb

## 2021-10-10 DIAGNOSIS — N926 Irregular menstruation, unspecified: Secondary | ICD-10-CM

## 2021-10-10 DIAGNOSIS — R5383 Other fatigue: Secondary | ICD-10-CM

## 2021-10-10 DIAGNOSIS — R0789 Other chest pain: Secondary | ICD-10-CM

## 2021-10-10 NOTE — Progress Notes (Signed)
Acute Office Visit  Subjective:    Patient ID: Nicole Chase, female    DOB: 1996-07-15, 26 y.o.   MRN: [id number]  Chief Complaint  Patient presents with   Menorrhagia   Fatigue    HPI: Patient is in today for cpmplaints of fatigue and episodes of feeling light headed over the past few weeks - states that her bp was elevated at a recent dental appt and she has taken her bp at home and got 140/84 She does mention that she started working out last week with a trainer and while doing cardio began having chest pain - she stopped exercising and the pain resolved  Pt also mentions that she has been having abnormal menstrual cycle - her last normal cycle was about 5 weeks ago and says that she has been having light spotting now for over 2 weeks  Past Medical History:  Diagnosis Date   Exertional dyspnea 12/29/2019   Gestational hypertension 12/29/2019   Hearing loss    Palpitations 12/29/2019   Pancreatic cyst    Pre-eclampsia    Pregnancy induced hypertension    VHL (von Hippel-Lindau syndrome)     Past Surgical History:  Procedure Laterality Date   WISDOM TOOTH EXTRACTION      Family History  Problem Relation Age of Onset   Healthy Mother    Von Hippel-Lindau syndrome Mother    Hearing loss Mother    Healthy Father    Breast cancer Paternal Grandmother    Von Hippel-Lindau syndrome Maternal Uncle    Von Hippel-Lindau syndrome Maternal Grandmother     Social History   Socioeconomic History   Marital status: Single    Spouse name: Not on file   Number of children: 1   Years of education: Not on file   Highest education level: Not on file  Occupational History   Occupation: RN  Tobacco Use   Smoking status: Never   Smokeless tobacco: Never  Vaping Use   Vaping Use: Never used  Substance and Sexual Activity   Alcohol use: Yes    Alcohol/week: 3.0 standard drinks    Types: 3 Glasses of wine per week    Comment: ocassionally   Drug use: No   Sexual activity:  Yes    Partners: Male    Birth control/protection: None  Other Topics Concern   Not on file  Social History Narrative   Not on file   Social Determinants of Health   Financial Resource Strain: Not on file  Food Insecurity: Not on file  Transportation Needs: Not on file  Physical Activity: Not on file  Stress: Not on file  Social Connections: Not on file  Intimate Partner Violence: Not on file    Outpatient Medications Prior to Visit  Medication Sig Dispense Refill   amphetamine-dextroamphetamine (ADDERALL XR) 20 MG 24 hr capsule Take 1 capsule by mouth daily 90 capsule 0   amphetamine-dextroamphetamine (ADDERALL) 20 MG tablet Take 1 tablet by mouth daily 60 tablet 0   cimetidine (TAGAMET) 400 MG tablet Take 400 mg by mouth daily.     Multiple Vitamins-Minerals (ZINC PO) Take 1 tablet by mouth daily.     amphetamine-dextroamphetamine (ADDERALL XR) 20 MG 24 hr capsule TAKE 1 CAPSULE BY MOUTH DAILY 90 capsule 0   No facility-administered medications prior to visit.    Allergies  Allergen Reactions   Cephalexin Hives    Review of Systems CONSTITUTIONAL: see HPI E/N/T: Negative for ear pain, nasal congestion and sore  throat.  CARDIOVASCULAR: see HPI RESPIRATORY: Negative for recent cough and dyspnea.  GASTROINTESTINAL: Negative for abdominal pain, acid reflux symptoms, constipation, diarrhea, nausea and vomiting.  GU - see HPI INTEGUMENTARY: Negative for rash.          Objective:    Physical Exam PHYSICAL EXAM:   VS: BP 120/78    Pulse 76    Temp (!) 97.4 F (36.3 C)    Ht 5\' 7"  (1.702 m)    Wt 197 lb 12.8 oz (89.7 kg)    SpO2 98%    BMI 30.98 kg/m   GEN: Well nourished, well developed, in no acute distress  Cardiac: RRR; no murmurs, rubs, or gallops,no edema -  Respiratory:  normal respiratory rate and pattern with no distress - normal breath sounds with no rales, rhonchi, wheezes or rubs Skin: warm and dry, no rash  Neuro:  Alert and Oriented x 3,- CN II-Xii  grossly intact Psych: euthymic mood, appropriate affect and demeanor  EKG - no acute changes BP 120/78    Pulse 76    Temp (!) 97.4 F (36.3 C)    Ht 5\' 7"  (1.702 m)    Wt 197 lb 12.8 oz (89.7 kg)    SpO2 98%    BMI 30.98 kg/m  Wt Readings from Last 3 Encounters:  10/10/21 197 lb 12.8 oz (89.7 kg)  08/08/21 198 lb 14.4 oz (90.2 kg)  05/16/21 200 lb (90.7 kg)    Health Maintenance Due  Topic Date Due   HPV VACCINES (1 - 2-dose series) Never done   Hepatitis C Screening  Never done   TETANUS/TDAP  Never done   PAP-Cervical Cytology Screening  Never done   PAP SMEAR-Modifier  Never done       Topic Date Due   HPV VACCINES (1 - 2-dose series) Never done     Lab Results  Component Value Date   TSH 0.697 08/16/2020   Lab Results  Component Value Date   WBC 10.9 (H) 08/16/2020   HGB 15.3 08/16/2020   HCT 45.3 08/16/2020   MCV 88 08/16/2020   PLT 364 08/16/2020   Lab Results  Component Value Date   NA 138 08/16/2020   K 4.9 08/16/2020   CO2 23 08/16/2020   GLUCOSE 81 08/16/2020   BUN 11 08/16/2020   CREATININE 1.12 (H) 08/16/2020   BILITOT 0.3 08/16/2020   ALKPHOS 70 08/16/2020   AST 31 08/16/2020   ALT 57 (H) 08/16/2020   PROT 7.4 08/16/2020   ALBUMIN 4.7 08/16/2020   CALCIUM 10.1 08/16/2020   ANIONGAP 10 04/14/2020   No results found for: CHOL No results found for: HDL No results found for: LDLCALC No results found for: TRIG No results found for: CHOLHDL No results found for: HGBA1C     Assessment & Plan:   Problem List Items Addressed This Visit       Other   Other fatigue - Primary   Relevant Orders   CBC with Differential/Platelet   Comprehensive metabolic panel   TSH   Other Visit Diagnoses     Other chest pain       Relevant Orders   EKG 12-Lead   Abnormal menses       Relevant Orders   CBC with Differential/Platelet   Comprehensive metabolic panel   TSH   Beta hCG quant (ref lab)      No orders of the defined types were placed  in this encounter.   Orders Placed  This Encounter  Procedures   CBC with Differential/Platelet   Comprehensive metabolic panel   TSH   Beta hCG quant (ref lab)   EKG 12-Lead    If labwork normal will refer to cardiology for further evaluation Follow-up: Return in about 4 weeks (around 11/07/2021) for follow up.  An After Visit Summary was printed and given to the patient.  Yetta Flock [facility] [phone]

## 2021-10-11 LAB — CBC WITH DIFFERENTIAL/PLATELET
Basophils Absolute: 0.1 10*3/uL (ref 0.0–0.2)
Basos: 1 %
EOS (ABSOLUTE): 0.1 10*3/uL (ref 0.0–0.4)
Eos: 1 %
Hematocrit: 45.3 % (ref 34.0–46.6)
Hemoglobin: 15.3 g/dL (ref 11.1–15.9)
Immature Grans (Abs): 0 10*3/uL (ref 0.0–0.1)
Immature Granulocytes: 0 %
Lymphocytes Absolute: 3 10*3/uL (ref 0.7–3.1)
Lymphs: 36 %
MCH: 30.8 pg (ref 26.6–33.0)
MCHC: 33.8 g/dL (ref 31.5–35.7)
MCV: 91 fL (ref 79–97)
Monocytes Absolute: 0.7 10*3/uL (ref 0.1–0.9)
Monocytes: 8 %
Neutrophils Absolute: 4.6 10*3/uL (ref 1.4–7.0)
Neutrophils: 54 %
Platelets: 293 10*3/uL (ref 150–450)
RBC: 4.96 x10E6/uL (ref 3.77–5.28)
RDW: 12.4 % (ref 11.7–15.4)
WBC: 8.5 10*3/uL (ref 3.4–10.8)

## 2021-10-11 LAB — COMPREHENSIVE METABOLIC PANEL
ALT: 93 IU/L — ABNORMAL HIGH (ref 0–32)
AST: 65 IU/L — ABNORMAL HIGH (ref 0–40)
Albumin/Globulin Ratio: 1.3 (ref 1.2–2.2)
Albumin: 4.6 g/dL (ref 3.9–5.0)
Alkaline Phosphatase: 57 IU/L (ref 44–121)
BUN/Creatinine Ratio: 18 (ref 9–23)
BUN: 16 mg/dL (ref 6–20)
Bilirubin Total: 0.4 mg/dL (ref 0.0–1.2)
CO2: 21 mmol/L (ref 20–29)
Calcium: 10.2 mg/dL (ref 8.7–10.2)
Chloride: 101 mmol/L (ref 96–106)
Creatinine, Ser: 0.88 mg/dL (ref 0.57–1.00)
Globulin, Total: 3.5 g/dL (ref 1.5–4.5)
Glucose: 71 mg/dL (ref 70–99)
Potassium: 4.8 mmol/L (ref 3.5–5.2)
Sodium: 140 mmol/L (ref 134–144)
Total Protein: 8.1 g/dL (ref 6.0–8.5)
eGFR: 93 mL/min/{1.73_m2} (ref >59)

## 2021-10-11 LAB — BETA HCG QUANT (REF LAB): hCG Quant: <1 m[IU]/mL

## 2021-10-11 LAB — TSH: TSH: 0.716 u[IU]/mL (ref 0.450–4.500)

## 2021-10-15 ENCOUNTER — Ambulatory Visit: Payer: BC Managed Care – PPO | Admitting: Nurse Practitioner

## 2021-10-15 ENCOUNTER — Encounter: Payer: Self-pay | Admitting: Nurse Practitioner

## 2021-10-15 VITALS — BP 118/78 | HR 76 | Temp 97.1°F | Ht 67.0 in | Wt 197.0 lb

## 2021-10-15 DIAGNOSIS — J018 Other acute sinusitis: Secondary | ICD-10-CM

## 2021-10-15 LAB — POCT RAPID STREP A (OFFICE): Rapid Strep A Screen: NEGATIVE

## 2021-10-15 MED ORDER — FLUTICASONE PROPIONATE 50 MCG/ACT NA SUSP
2.0000 | Freq: Every day | NASAL | 6 refills | Status: DC
Start: 2021-10-15 — End: 2021-12-18

## 2021-10-15 MED ORDER — AZITHROMYCIN 250 MG PO TABS: ORAL_TABLET | ORAL | 0 refills | Status: AC

## 2021-10-15 NOTE — Progress Notes (Signed)
Acute Office Visit  Subjective:    Patient ID: Nicole Chase, female    DOB: Jan 25, 1996, 26 y.o.   MRN: [id number]  Chief Complaint  Patient presents with   URI    HPI: Patient is in today for Upper respiratory symptoms She complains of congestion, facial pain, headache described as dull, nasal congestion, post nasal drip, productive cough, sinus pressure, and sore throat. Onset of symptoms was a few days ago and worsening. Treatment has included Ibuprofen, Sudafed, and Claritin OTC.  Past history is significant for chronic allergic rhinitis. Patient is non-smoker.    Past Medical History:  Diagnosis Date   Exertional dyspnea 12/29/2019   Gestational hypertension 12/29/2019   Hearing loss    Palpitations 12/29/2019   Pancreatic cyst    Pre-eclampsia    Pregnancy induced hypertension    VHL (von Hippel-Lindau syndrome)     Past Surgical History:  Procedure Laterality Date   WISDOM TOOTH EXTRACTION      Family History  Problem Relation Age of Onset   Healthy Mother    Von Hippel-Lindau syndrome Mother    Hearing loss Mother    Healthy Father    Breast cancer Paternal Grandmother    Von Hippel-Lindau syndrome Maternal Uncle    Von Hippel-Lindau syndrome Maternal Grandmother     Social History   Socioeconomic History   Marital status: Single    Spouse name: Not on file   Number of children: 1   Years of education: Not on file   Highest education level: Not on file  Occupational History   Occupation: RN  Tobacco Use   Smoking status: Never   Smokeless tobacco: Never  Vaping Use   Vaping Use: Never used  Substance and Sexual Activity   Alcohol use: Yes    Alcohol/week: 3.0 standard drinks    Types: 3 Glasses of wine per week    Comment: ocassionally   Drug use: No   Sexual activity: Yes    Partners: Male    Birth control/protection: None  Other Topics Concern   Not on file  Social History Narrative   Not on file   Social Determinants of Health    Financial Resource Strain: Not on file  Food Insecurity: Not on file  Transportation Needs: Not on file  Physical Activity: Not on file  Stress: Not on file  Social Connections: Not on file  Intimate Partner Violence: Not on file    Outpatient Medications Prior to Visit  Medication Sig Dispense Refill   amphetamine-dextroamphetamine (ADDERALL XR) 20 MG 24 hr capsule Take 1 capsule by mouth daily 90 capsule 0   amphetamine-dextroamphetamine (ADDERALL) 20 MG tablet Take 1 tablet by mouth daily 60 tablet 0   cimetidine (TAGAMET) 400 MG tablet Take 400 mg by mouth daily.     Multiple Vitamins-Minerals (ZINC PO) Take 1 tablet by mouth daily.     No facility-administered medications prior to visit.    Allergies  Allergen Reactions   Cephalexin Hives    Review of Systems  Constitutional:  Positive for fatigue. Negative for chills and fever.  HENT:  Positive for congestion, ear pain (right ear, popping, fullness), postnasal drip, rhinorrhea, sinus pressure, sinus pain and sore throat.   Respiratory:  Positive for cough. Negative for shortness of breath.   Cardiovascular:  Positive for chest pain.  Gastrointestinal:  Negative for diarrhea and nausea.  Allergic/Immunologic: Positive for environmental allergies.  Neurological:  Positive for headaches. Negative for dizziness.  Objective:    Physical Exam Vitals reviewed.  HENT:     Head: Normocephalic.     Right Ear: Tympanic membrane normal.     Left Ear: Tympanic membrane normal.     Nose: Nasal tenderness, congestion and rhinorrhea present.     Right Sinus: Frontal sinus tenderness present.     Left Sinus: Frontal sinus tenderness present.     Mouth/Throat:     Pharynx: Posterior oropharyngeal erythema present.  Cardiovascular:     Rate and Rhythm: Normal rate and regular rhythm.     Pulses: Normal pulses.     Heart sounds: Normal heart sounds.  Pulmonary:     Effort: Pulmonary effort is normal.     Breath sounds:  Normal breath sounds.  Abdominal:     General: Bowel sounds are normal.     Palpations: Abdomen is soft.  Musculoskeletal:     Cervical back: Neck supple. Tenderness (with palpation to right tonsillar lymphnodes) present.  Lymphadenopathy:     Cervical: No cervical adenopathy.  Skin:    General: Skin is warm and dry.     Capillary Refill: Capillary refill takes less than 2 seconds.  Neurological:     General: No focal deficit present.     Mental Status: She is alert and oriented to person, place, and time.    Pulse 76    Temp (!) 97.1 F (36.2 C)    Ht '5\' 7"'  (1.702 m)    Wt 197 lb (89.4 kg)    SpO2 98%    BMI 30.85 kg/m  BP 118/78    Pulse 76    Temp (!) 97.1 F (36.2 C)    Ht '5\' 7"'  (1.702 m)    Wt 197 lb (89.4 kg)    SpO2 98%    BMI 30.85 kg/m   Wt Readings from Last 3 Encounters:  10/15/21 197 lb (89.4 kg)  10/10/21 197 lb 12.8 oz (89.7 kg)  08/08/21 198 lb 14.4 oz (90.2 kg)    Health Maintenance Due  Topic Date Due   HPV VACCINES (1 - 2-dose series) Never done   Hepatitis C Screening  Never done   TETANUS/TDAP  Never done   PAP-Cervical Cytology Screening  Never done   PAP SMEAR-Modifier  Never done       Topic Date Due   HPV VACCINES (1 - 2-dose series) Never done     Lab Results  Component Value Date   TSH 0.716 10/10/2021   Lab Results  Component Value Date   WBC 8.5 10/10/2021   HGB 15.3 10/10/2021   HCT 45.3 10/10/2021   MCV 91 10/10/2021   PLT 293 10/10/2021   Lab Results  Component Value Date   NA 140 10/10/2021   K 4.8 10/10/2021   CO2 21 10/10/2021   GLUCOSE 71 10/10/2021   BUN 16 10/10/2021   CREATININE 0.88 10/10/2021   BILITOT 0.4 10/10/2021   ALKPHOS 57 10/10/2021   AST 65 (H) 10/10/2021   ALT 93 (H) 10/10/2021   PROT 8.1 10/10/2021   ALBUMIN 4.6 10/10/2021   CALCIUM 10.2 10/10/2021   ANIONGAP 10 04/14/2020   EGFR 93 10/10/2021       Assessment & Plan:   1. Other acute sinusitis, recurrence not specified - fluticasone  (FLONASE) 50 MCG/ACT nasal spray; Place 2 sprays into both nostrils daily.  Dispense: 16 g; Refill: 6 - azithromycin (ZITHROMAX) 250 MG tablet; Take 2 tablets on day 1, then 1 tablet daily on  days 2 through 5  Dispense: 6 tablet; Refill: 0 - POCT rapid strep A-NEGATIVE    Take Z-pack as prescribed Use Flonase nasal spray and Claritin daily Warm salt water gargles as needed Push fluids Replace toothbrush and toothpaste Follow-up as needed  Follow-up: PRN  An After Visit Summary was printed and given to the patient.  I, Rip Harbour, NP, have reviewed all documentation for this visit. The documentation on 10/15/21 for the exam, diagnosis, procedures, and orders are all accurate and complete.    Signed, Rip Harbour, NP [place] [phone]

## 2021-10-15 NOTE — Patient Instructions (Signed)
Take Z-pack as prescribed Use Flonase nasal spray and Claritin daily Warm salt water gargles as needed Push fluids Replace toothbrush and toothpaste Follow-up as needed  Sinusitis, Adult Sinusitis is soreness and swelling (inflammation) of your sinuses. Sinuses are hollow spaces in the bones around your face. They are located: Around your eyes. In the middle of your forehead. Behind your nose. In your cheekbones. Your sinuses and nasal passages are lined with a fluid called mucus. Mucus drains out of your sinuses. Swelling can trap mucus in your sinuses. This lets germs (bacteria, virus, or fungus) grow, which leads to infection. Most of the time, this condition is caused by a virus. What are the causes? This condition is caused by: Allergies. Asthma. Germs. Things that block your nose or sinuses. Growths in the nose (nasal polyps). Chemicals or irritants in the air. Fungus (rare). What increases the risk? You are more likely to develop this condition if: You have a weak body defense system (immune system). You do a lot of swimming or diving. You use nasal sprays too much. You smoke. What are the signs or symptoms? The main symptoms of this condition are pain and a feeling of pressure around the sinuses. Other symptoms include: Stuffy nose (congestion). Runny nose (drainage). Swelling and warmth in the sinuses. Headache. Toothache. A cough that may get worse at night. Mucus that collects in the throat or the back of the nose (postnasal drip). Being unable to smell and taste. Being very tired (fatigue). A fever. Sore throat. Bad breath. How is this diagnosed? This condition is diagnosed based on: Your symptoms. Your medical history. A physical exam. Tests to find out if your condition is short-term (acute) or long-term (chronic). Your doctor may: Check your nose for growths (polyps). Check your sinuses using a tool that has a light (endoscope). Check for allergies or  germs. Do imaging tests, such as an MRI or CT scan. How is this treated? Treatment for this condition depends on the cause and whether it is short-term or long-term. If caused by a virus, your symptoms should go away on their own within 10 days. You may be given medicines to relieve symptoms. They include: Medicines that shrink swollen tissue in the nose. Medicines that treat allergies (antihistamines). A spray that treats swelling of the nostrils.  Rinses that help get rid of thick mucus in your nose (nasal saline washes). If caused by bacteria, your doctor may wait to see if you will get better without treatment. You may be given antibiotic medicine if you have: A very bad infection. A weak body defense system. If caused by growths in the nose, you may need to have surgery. Follow these instructions at home: Medicines Take, use, or apply over-the-counter and prescription medicines only as told by your doctor. These may include nasal sprays. If you were prescribed an antibiotic medicine, take it as told by your doctor. Do not stop taking the antibiotic even if you start to feel better. Hydrate and humidify  Drink enough water to keep your pee (urine) pale yellow. Use a cool mist humidifier to keep the humidity level in your home above 50%. Breathe in steam for 10-15 minutes, 3-4 times a day, or as told by your doctor. You can do this in the bathroom while a hot shower is running. Try not to spend time in cool or dry air. Rest Rest as much as you can. Sleep with your head raised (elevated). Make sure you get enough sleep each night. General  instructions  Put a warm, moist washcloth on your face 3-4 times a day, or as often as told by your doctor. This will help with discomfort. Wash your hands often with soap and water. If there is no soap and water, use hand sanitizer. Do not smoke. Avoid being around people who are smoking (secondhand smoke). Keep all follow-up visits as told by  your doctor. This is important. Contact a doctor if: You have a fever. Your symptoms get worse. Your symptoms do not get better within 10 days. Get help right away if: You have a very bad headache. You cannot stop throwing up (vomiting). You have very bad pain or swelling around your face or eyes. You have trouble seeing. You feel confused. Your neck is stiff. You have trouble breathing. Summary Sinusitis is swelling of your sinuses. Sinuses are hollow spaces in the bones around your face. This condition is caused by tissues in your nose that become inflamed or swollen. This traps germs. These can lead to infection. If you were prescribed an antibiotic medicine, take it as told by your doctor. Do not stop taking it even if you start to feel better. Keep all follow-up visits as told by your doctor. This is important. This information is not intended to replace advice given to you by your health care provider. Make sure you discuss any questions you have with your health care provider. Document Revised: 01/26/2018 Document Reviewed: 01/26/2018 Elsevier Patient Education  2022 [employer].

## 2021-11-01 DIAGNOSIS — N938 Other specified abnormal uterine and vaginal bleeding: Secondary | ICD-10-CM | POA: Diagnosis not present

## 2021-11-02 ENCOUNTER — Other Ambulatory Visit (HOSPITAL_COMMUNITY): Payer: Self-pay

## 2021-11-05 ENCOUNTER — Other Ambulatory Visit (HOSPITAL_COMMUNITY): Payer: Self-pay

## 2021-11-06 ENCOUNTER — Other Ambulatory Visit (HOSPITAL_COMMUNITY): Payer: Self-pay

## 2021-11-06 MED ORDER — AMPHETAMINE-DEXTROAMPHET ER 10 MG PO CP24
20.0000 mg | ORAL_CAPSULE | Freq: Every morning | ORAL | 0 refills | Status: DC
  Filled 2021-11-07: qty 30, 30d supply, fill #0

## 2021-11-06 MED ORDER — AMPHETAMINE-DEXTROAMPHETAMINE 20 MG PO TABS
20.0000 mg | ORAL_TABLET | Freq: Every day | ORAL | 0 refills | Status: DC
  Filled 2021-11-07: qty 30, 30d supply, fill #0

## 2021-11-07 ENCOUNTER — Ambulatory Visit: Payer: BC Managed Care – PPO

## 2021-11-07 ENCOUNTER — Other Ambulatory Visit (HOSPITAL_COMMUNITY): Payer: Self-pay

## 2021-11-07 VITALS — BP 116/70

## 2021-11-07 DIAGNOSIS — R030 Elevated blood-pressure reading, without diagnosis of hypertension: Secondary | ICD-10-CM

## 2021-11-08 ENCOUNTER — Other Ambulatory Visit: Payer: BC Managed Care – PPO

## 2021-11-08 ENCOUNTER — Other Ambulatory Visit: Payer: Self-pay

## 2021-11-08 DIAGNOSIS — R748 Abnormal levels of other serum enzymes: Secondary | ICD-10-CM

## 2021-11-09 ENCOUNTER — Other Ambulatory Visit (HOSPITAL_COMMUNITY): Payer: Self-pay

## 2021-11-09 LAB — CARDIOVASCULAR RISK ASSESSMENT

## 2021-11-09 LAB — COMPREHENSIVE METABOLIC PANEL
ALT: 77 IU/L — ABNORMAL HIGH (ref 0–32)
AST: 32 IU/L (ref 0–40)
Albumin/Globulin Ratio: 2 (ref 1.2–2.2)
Albumin: 4.7 g/dL (ref 3.9–5.0)
Alkaline Phosphatase: 54 IU/L (ref 44–121)
BUN/Creatinine Ratio: 12 (ref 9–23)
BUN: 10 mg/dL (ref 6–20)
Bilirubin Total: <0.2 mg/dL (ref 0.0–1.2)
CO2: 24 mmol/L (ref 20–29)
Calcium: 9.7 mg/dL (ref 8.7–10.2)
Chloride: 107 mmol/L — ABNORMAL HIGH (ref 96–106)
Creatinine, Ser: 0.84 mg/dL (ref 0.57–1.00)
Globulin, Total: 2.4 g/dL (ref 1.5–4.5)
Glucose: 96 mg/dL (ref 70–99)
Potassium: 4.3 mmol/L (ref 3.5–5.2)
Sodium: 141 mmol/L (ref 134–144)
Total Protein: 7.1 g/dL (ref 6.0–8.5)
eGFR: 99 mL/min/{1.73_m2} (ref >59)

## 2021-11-09 LAB — LIPID PANEL
Chol/HDL Ratio: 3.4 ratio (ref 0.0–4.4)
Cholesterol, Total: 152 mg/dL (ref 100–199)
HDL: 45 mg/dL (ref >39)
LDL Chol Calc (NIH): 90 mg/dL (ref 0–99)
Triglycerides: 89 mg/dL (ref 0–149)
VLDL Cholesterol Cal: 17 mg/dL (ref 5–40)

## 2021-11-15 DIAGNOSIS — N921 Excessive and frequent menstruation with irregular cycle: Secondary | ICD-10-CM | POA: Diagnosis not present

## 2021-11-28 DIAGNOSIS — E282 Polycystic ovarian syndrome: Secondary | ICD-10-CM | POA: Diagnosis not present

## 2021-12-18 ENCOUNTER — Ambulatory Visit: Payer: BC Managed Care – PPO | Admitting: Legal Medicine

## 2021-12-18 ENCOUNTER — Encounter: Payer: Self-pay | Admitting: Legal Medicine

## 2021-12-18 VITALS — BP 118/72 | HR 67 | Temp 98.5°F | Ht 67.0 in | Wt 188.4 lb

## 2021-12-18 DIAGNOSIS — R1010 Upper abdominal pain, unspecified: Secondary | ICD-10-CM | POA: Diagnosis not present

## 2021-12-18 DIAGNOSIS — Q8583 Von Hippel-Lindau syndrome: Secondary | ICD-10-CM | POA: Diagnosis not present

## 2021-12-18 DIAGNOSIS — K862 Cyst of pancreas: Secondary | ICD-10-CM | POA: Insufficient documentation

## 2021-12-18 MED ORDER — OMEPRAZOLE 40 MG PO CPDR: 40.0000 mg | DELAYED_RELEASE_CAPSULE | Freq: Every day | ORAL | 3 refills | Status: DC

## 2021-12-18 NOTE — Progress Notes (Signed)
? ?Acute Office Visit ? ?Subjective:  ? ? Patient ID: Nicole Chase, female    DOB: August 04, 1996, 26 y.o.   MRN: [id number] ? ?Chief Complaint  ?Patient presents with  ? right upper quad pain  ? ? ?HPI: ?Patient is in today for right upper quadrant pain Yesterday, gone now.  Just started nuvaring . Patient stated at 3am she started feeling really nausea, around 6am patient states everytime she got up she was very dizzy,and nausea. Around 9am she vomited, and was still very dizzy. Patient states at first it was just middle of abdomen and now its in right upper quad pain. Patient states pain was very constant, no pain at the moment.  ? ?She ate heavily the day before (Easter), no fatty food intolerance.  History of pancreatic cyst 2014 , not followed up. 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 ?Past Medical History:  ?Diagnosis Date  ? Exertional dyspnea 12/29/2019  ? Gestational hypertension 12/29/2019  ? Hearing loss   ? Palpitations 12/29/2019  ? Pancreatic cyst   ? Pre-eclampsia   ? Pregnancy induced hypertension   ? 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 ? ?Past Surgical History:  ?Procedure Laterality Date  ? WISDOM TOOTH EXTRACTION    ? ? ?Family History  ?Problem Relation Age of Onset  ? Healthy Mother   ? Von Hippel-Lindau syndrome Mother   ? Hearing loss Mother   ? Healthy Father   ? Breast cancer Paternal Grandmother   ? Von Hippel-Lindau syndrome Maternal Uncle   ? Von Hippel-Lindau syndrome Maternal Grandmother   ? ? ?Social History  ? ?Socioeconomic History  ? Marital status: Single  ?  Spouse name: Not on file  ? Number of children: 1  ? Years of education: Not on file  ? Highest education level: Not on file  ?Occupational History  ? Occupation: Therapist, sports  ?Tobacco Use  ? Smoking status: Never  ? Smokeless tobacco: Never  ?Vaping Use  ? Vaping Use: Never used  ?Substance and Sexual Activity  ? Alcohol use: Yes  ?  Alcohol/week: 3.0 standard drinks  ?  Types: 3 Glasses of wine per  week  ?  Comment: ocassionally  ? Drug use: No  ? Sexual activity: Yes  ?  Partners: Male  ?  Birth control/protection: None  ?Other Topics Concern  ? Not on file  ?Social History Narrative  ? Not on file  ? ?Social Determinants of Health  ? ?Financial Resource Strain: Not on file  ?Food Insecurity: Not on file  ?Transportation Needs: Not on file  ?Physical Activity: Not on file  ?Stress: Not on file  ?Social Connections: Not on file  ?Intimate Partner Violence: Not on file  ? ? ?Outpatient Medications Prior to Visit  ?Medication Sig Dispense Refill  ? amphetamine-dextroamphetamine (ADDERALL XR) 20 MG 24 hr capsule Take 1 capsule by mouth daily 90 capsule 0  ? amphetamine-dextroamphetamine (ADDERALL) 20 MG tablet Take 1 tablet by mouth daily 30 tablet 0  ? cimetidine (TAGAMET) 400 MG tablet Take 400 mg by mouth daily.    ? NUVARING 0.12-0.015 MG/24HR vaginal ring Place vaginally.    ? amphetamine-dextroamphetamine (ADDERALL XR) 10 MG 24 hr capsule Take 2 capsules by mouth every morning. 60 capsule 0  ? fluticasone (FLONASE) 50 MCG/ACT nasal spray Place 2 sprays into both nostrils daily. 16 g 6  ? Multiple Vitamins-Minerals (ZINC PO) Take 1 tablet by mouth daily.    ? ?No  facility-administered medications prior to visit.  ? ? ?Allergies  ?Allergen Reactions  ? Cephalexin Hives  ? ? ?Review of Systems  ?Constitutional:  Negative for appetite change, chills, fatigue and fever.  ?HENT:  Negative for congestion, ear discharge, ear pain, rhinorrhea, sinus pressure, sneezing and sore throat.   ?Eyes:  Negative for visual disturbance.  ?Respiratory:  Negative for cough, chest tightness, shortness of breath and wheezing.   ?Cardiovascular:  Negative for chest pain, palpitations and leg swelling.  ?Gastrointestinal:  Positive for abdominal pain, nausea and vomiting. Negative for diarrhea.  ?Endocrine: Negative for polydipsia, polyphagia and polyuria.  ?Genitourinary:  Negative for difficulty urinating, dysuria, frequency,  hematuria, menstrual problem, urgency, vaginal bleeding, vaginal discharge and vaginal pain.  ?Musculoskeletal:  Negative for back pain, gait problem, joint swelling, myalgias and neck pain.  ?Neurological:  Positive for dizziness, weakness and headaches. Negative for seizures, syncope and numbness.  ?Psychiatric/Behavioral:  Negative for agitation, confusion, hallucinations, sleep disturbance and suicidal ideas. The patient is not nervous/anxious.   ? ?   ?Objective:  ?  ?Physical Exam ?Vitals reviewed.  ?Constitutional:   ?   General: She is not in acute distress. ?   Appearance: Normal appearance.  ?HENT:  ?   Head: Normocephalic.  ?   Right Ear: Tympanic membrane normal.  ?   Left Ear: Tympanic membrane normal.  ?   Mouth/Throat:  ?   Mouth: Mucous membranes are moist.  ?Eyes:  ?   Conjunctiva/sclera: Conjunctivae normal.  ?   Pupils: Pupils are equal, round, and reactive to light.  ?Cardiovascular:  ?   Rate and Rhythm: Normal rate and regular rhythm.  ?   Pulses: Normal pulses.  ?   Heart sounds: Normal heart sounds. No murmur heard. ?  No gallop.  ?Pulmonary:  ?   Effort: Pulmonary effort is normal. No respiratory distress.  ?   Breath sounds: Normal breath sounds. No wheezing.  ?Abdominal:  ?   General: Abdomen is flat. Bowel sounds are normal. There is no distension.  ?   Palpations: Abdomen is soft.  ?   Tenderness: There is abdominal tenderness (epigastric).  ?Musculoskeletal:     ?   General: Normal range of motion.  ?   Cervical back: Normal range of motion.  ?   Right lower leg: No edema.  ?   Left lower leg: No edema.  ?Skin: ?   General: Skin is warm and dry.  ?   Capillary Refill: Capillary refill takes less than 2 seconds.  ?Neurological:  ?   General: No focal deficit present.  ?   Mental Status: She is alert and oriented to person, place, and time. Mental status is at baseline.  ?Psychiatric:     ?   Mood and Affect: Mood normal.  ? ? ?BP 118/72   Pulse 67   Temp 98.5 ?F (36.9 ?C)   Ht _0   (1.702 m)   Wt 188 lb 6.4 oz (85.5 kg)   SpO2 97%   BMI 29.51 kg/m?  ?Wt Readings from Last 3 Encounters:  ?12/18/21 188 lb 6.4 oz (85.5 kg)  ?10/15/21 197 lb (89.4 kg)  ?10/10/21 197 lb 12.8 oz (89.7 kg)  ? ? ?Health Maintenance Due  ?Topic Date Due  ? HPV VACCINES (1 - 2-dose series) Never done  ? Hepatitis C Screening  Never done  ? TETANUS/TDAP  Never done  ? PAP-Cervical Cytology Screening  Never done  ? PAP SMEAR-Modifier  Never done  ? ? ?   ?  Topic Date Due  ? HPV VACCINES (1 - 2-dose series) Never done  ? ? ? ?Lab Results  ?Component Value Date  ? TSH 0.716 10/10/2021  ? ?Lab Results  ?Component Value Date  ? WBC 8.5 10/10/2021  ? HGB 15.3 10/10/2021  ? HCT 45.3 10/10/2021  ? MCV 91 10/10/2021  ? PLT 293 10/10/2021  ? ?Lab Results  ?Component Value Date  ? NA 141 11/08/2021  ? K 4.3 11/08/2021  ? CO2 24 11/08/2021  ? GLUCOSE 96 11/08/2021  ? BUN 10 11/08/2021  ? CREATININE 0.84 11/08/2021  ? BILITOT <0.2 11/08/2021  ? ALKPHOS 54 11/08/2021  ? AST 32 11/08/2021  ? ALT 77 (H) 11/08/2021  ? PROT 7.1 11/08/2021  ? ALBUMIN 4.7 11/08/2021  ? CALCIUM 9.7 11/08/2021  ? ANIONGAP 10 04/14/2020  ? EGFR 99 11/08/2021  ? ?Lab Results  ?Component Value Date  ? CHOL 152 11/08/2021  ? ?Lab Results  ?Component Value Date  ? HDL 45 11/08/2021  ? ?Lab Results  ?Component Value Date  ? [place] 90 11/08/2021  ? ?Lab Results  ?Component Value Date  ? TRIG 89 11/08/2021  ? ?Lab Results  ?Component Value Date  ? CHOLHDL 3.4 11/08/2021  ? ?No results found for: HGBA1C ? ?   ?Assessment & Plan:  ? ?Problem List Items Addressed This Visit   ? ?  ? Digestive  ? Pancreatic cyst  ? Relevant Orders  ? CBC with Differential/Platelet  ? Comprehensive metabolic panel  ? Amylase  ? Lipase ?May be cause of her abdominal pain especially with pseudocyst  ?  ? Nervous and Auditory  ? 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 Relevant Orders  ? CBC with Differential/Platelet  ? Comprehensive metabolic panel ?Has not been followed up  ? ?Other  Visit Diagnoses   ? ? Upper abdominal pain    -  Primary  ? Relevant Medications  ? omeprazole (PRILOSEC) 40 MG capsule  ? Other Relevant Orders  ? US Abdomen Limited  ? CBC with Differential/Platelet  ? Compre

## 2021-12-19 ENCOUNTER — Other Ambulatory Visit: Payer: Self-pay

## 2021-12-19 LAB — COMPREHENSIVE METABOLIC PANEL
ALT: 37 IU/L — ABNORMAL HIGH (ref 0–32)
AST: 25 IU/L (ref 0–40)
Albumin/Globulin Ratio: 1.5 (ref 1.2–2.2)
Albumin: 4.3 g/dL (ref 3.9–5.0)
Alkaline Phosphatase: 57 IU/L (ref 44–121)
BUN/Creatinine Ratio: 9 (ref 9–23)
BUN: 9 mg/dL (ref 6–20)
Bilirubin Total: 0.4 mg/dL (ref 0.0–1.2)
CO2: 25 mmol/L (ref 20–29)
Calcium: 9.7 mg/dL (ref 8.7–10.2)
Chloride: 102 mmol/L (ref 96–106)
Creatinine, Ser: 0.95 mg/dL (ref 0.57–1.00)
Globulin, Total: 2.9 g/dL (ref 1.5–4.5)
Glucose: 84 mg/dL (ref 70–99)
Potassium: 4.7 mmol/L (ref 3.5–5.2)
Sodium: 140 mmol/L (ref 134–144)
Total Protein: 7.2 g/dL (ref 6.0–8.5)
eGFR: 85 mL/min/{1.73_m2} (ref >59)

## 2021-12-19 LAB — CBC WITH DIFFERENTIAL/PLATELET
Basophils Absolute: 0 10*3/uL (ref 0.0–0.2)
Basos: 0 %
EOS (ABSOLUTE): 0.1 10*3/uL (ref 0.0–0.4)
Eos: 2 %
Hematocrit: 43.4 % (ref 34.0–46.6)
Hemoglobin: 14.8 g/dL (ref 11.1–15.9)
Immature Grans (Abs): 0 10*3/uL (ref 0.0–0.1)
Immature Granulocytes: 0 %
Lymphocytes Absolute: 2 10*3/uL (ref 0.7–3.1)
Lymphs: 36 %
MCH: 30.6 pg (ref 26.6–33.0)
MCHC: 34.1 g/dL (ref 31.5–35.7)
MCV: 90 fL (ref 79–97)
Monocytes Absolute: 0.6 10*3/uL (ref 0.1–0.9)
Monocytes: 11 %
Neutrophils Absolute: 2.9 10*3/uL (ref 1.4–7.0)
Neutrophils: 51 %
Platelets: 278 10*3/uL (ref 150–450)
RBC: 4.83 x10E6/uL (ref 3.77–5.28)
RDW: 12 % (ref 11.7–15.4)
WBC: 5.7 10*3/uL (ref 3.4–10.8)

## 2021-12-19 LAB — AMYLASE: Amylase: 48 U/L (ref 31–110)

## 2021-12-19 LAB — LIPASE: Lipase: 30 U/L (ref 14–72)

## 2021-12-19 MED ORDER — ONDANSETRON HCL 4 MG PO TABS: 4.0000 mg | ORAL_TABLET | Freq: Four times a day (QID) | ORAL | 0 refills | Status: DC | PRN

## 2021-12-24 ENCOUNTER — Other Ambulatory Visit (HOSPITAL_COMMUNITY): Payer: Self-pay

## 2021-12-24 DIAGNOSIS — K760 Fatty (change of) liver, not elsewhere classified: Secondary | ICD-10-CM | POA: Diagnosis not present

## 2021-12-24 DIAGNOSIS — R1010 Upper abdominal pain, unspecified: Secondary | ICD-10-CM | POA: Diagnosis not present

## 2021-12-24 DIAGNOSIS — R109 Unspecified abdominal pain: Secondary | ICD-10-CM | POA: Diagnosis not present

## 2021-12-24 MED ORDER — AMPHETAMINE-DEXTROAMPHET ER 20 MG PO CP24
20.0000 mg | ORAL_CAPSULE | Freq: Every day | ORAL | 0 refills | Status: DC
  Filled 2021-12-24: qty 30, 30d supply, fill #0

## 2021-12-24 MED ORDER — AMPHETAMINE-DEXTROAMPHETAMINE 20 MG PO TABS
20.0000 mg | ORAL_TABLET | Freq: Every day | ORAL | 0 refills | Status: DC
  Filled 2021-12-24: qty 30, 30d supply, fill #0

## 2021-12-25 ENCOUNTER — Other Ambulatory Visit (HOSPITAL_COMMUNITY): Payer: Self-pay

## 2021-12-26 ENCOUNTER — Other Ambulatory Visit (HOSPITAL_COMMUNITY): Payer: Self-pay

## 2021-12-26 ENCOUNTER — Other Ambulatory Visit: Payer: Self-pay

## 2021-12-26 DIAGNOSIS — R1010 Upper abdominal pain, unspecified: Secondary | ICD-10-CM

## 2022-01-02 ENCOUNTER — Ambulatory Visit: Payer: BC Managed Care – PPO | Admitting: Physician Assistant

## 2022-01-02 ENCOUNTER — Encounter: Payer: Self-pay | Admitting: Physician Assistant

## 2022-01-02 VITALS — BP 100/68 | HR 71 | Temp 98.3°F | Ht 67.0 in | Wt 189.2 lb

## 2022-01-02 DIAGNOSIS — R1011 Right upper quadrant pain: Secondary | ICD-10-CM

## 2022-01-02 DIAGNOSIS — K828 Other specified diseases of gallbladder: Secondary | ICD-10-CM | POA: Diagnosis not present

## 2022-01-02 DIAGNOSIS — R110 Nausea: Secondary | ICD-10-CM | POA: Diagnosis not present

## 2022-01-02 MED ORDER — ONDANSETRON HCL 4 MG PO TABS: 4.0000 mg | ORAL_TABLET | Freq: Four times a day (QID) | ORAL | 1 refills | Status: DC | PRN

## 2022-01-02 NOTE — Progress Notes (Signed)
? ?Acute Office Visit ? ?Subjective:  ? ? Patient ID: Nicole Chase, female    DOB: 29-Apr-1996, 26 y.o.   MRN: [id number] ? ?Chief Complaint  ?Patient presents with  ? Abdominal Pain  ? ? ?HPI: ?Patient is in today for follow up of abdominal pain - she states she was seen by other provider 2 weeks ago for ruq and midepigastric pain with associated nausea and vomiting.  States that most symptoms worsened after eating.  She says symptoms persisted for the past several weeks.  Her abdominal pain is better however now she is still having nausea. 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 ? ?Past Medical History:  ?Diagnosis Date  ? Exertional dyspnea 12/29/2019  ? Gestational hypertension 12/29/2019  ? Hearing loss   ? Palpitations 12/29/2019  ? Pancreatic cyst   ? Pre-eclampsia   ? Pregnancy induced hypertension   ? 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 ? ?Past Surgical History:  ?Procedure Laterality Date  ? WISDOM TOOTH EXTRACTION    ? ? ?Family History  ?Problem Relation Age of Onset  ? Healthy Mother   ? Von Hippel-Lindau syndrome Mother   ? Hearing loss Mother   ? Healthy Father   ? Breast cancer Paternal Grandmother   ? Von Hippel-Lindau syndrome Maternal Uncle   ? Von Hippel-Lindau syndrome Maternal Grandmother   ? ? ?Social History  ? ?Socioeconomic History  ? Marital status: Single  ?  Spouse name: Not on file  ? Number of children: 1  ? Years of education: Not on file  ? Highest education level: Not on file  ?Occupational History  ? Occupation: Therapist, sports  ?Tobacco Use  ? Smoking status: Never  ? Smokeless tobacco: Never  ?Vaping Use  ? Vaping Use: Never used  ?Substance and Sexual Activity  ? Alcohol use: Yes  ?  Alcohol/week: 3.0 standard drinks  ?  Types: 3 Glasses of wine per week  ?  Comment: ocassionally  ? Drug use: No  ? Sexual  activity: Yes  ?  Partners: Male  ?  Birth control/protection: None  ?Other Topics Concern  ? Not on file  ?Social History Narrative  ? Not on file  ? ?Social Determinants of Health  ? ?Financial Resource Strain: Not on file  ?Food Insecurity: Not on file  ?Transportation Needs: Not on file  ?Physical Activity: Not on file  ?Stress: Not on file  ?Social Connections: Not on file  ?Intimate Partner Violence: Not on file  ? ? ?Outpatient Medications Prior to Visit  ?Medication Sig Dispense Refill  ? amphetamine-dextroamphetamine (ADDERALL XR) 20 MG 24 hr capsule Take 1 capsule by mouth daily 30 capsule 0  ? amphetamine-dextroamphetamine (ADDERALL) 20 MG tablet Take 1 tablet by mouth daily 30 tablet 0  ? cimetidine (TAGAMET) 400 MG tablet Take 400 mg by mouth daily.    ? NUVARING 0.12-0.015 MG/24HR vaginal ring Place vaginally.    ? omeprazole (PRILOSEC) 40 MG capsule Take 1 capsule (40 mg total) by mouth daily. 30 capsule 3  ? ondansetron (ZOFRAN) 4 MG tablet Take 1 tablet (4 mg total) by mouth every 6 (six) hours as needed for nausea or vomiting. 20 tablet 0  ? amphetamine-dextroamphetamine (ADDERALL XR) 20 MG 24 hr capsule Take 1 capsule by mouth daily 90 capsule 0  ? ?  No facility-administered medications prior to visit.  ? ? ?Allergies  ?Allergen Reactions  ? Cephalexin Hives  ? ? ?Review of Systems ?CONSTITUTIONAL: Negative for chills, fatigue, fever, unintentional weight gain and unintentional weight loss.  ? ?CARDIOVASCULAR: Negative for chest pain, ?RESPIRATORY: Negative for recent cough and dyspnea.  ?GASTROINTESTINAL: see HPI ? ?INTEGUMENTARY: Negative for rash.  ? ?   ? ?   ?Objective:  ?  ?Physical Exam ?PHYSICAL EXAM:  ? ?VS: BP 100/68   Pulse 71   Temp 98.3 ?F (36.8 ?C)   Ht '5\' 7"'  (1.702 m)   Wt 189 lb 3.2 oz (85.8 kg)   SpO2 98%   BMI 29.63 kg/m?  ? ?GEN: Well nourished, well developed, in no acute distress  ?Cardiac: RRR; no murmurs, ?Respiratory:  normal respiratory rate and pattern with no  distress - normal breath sounds with no rales, rhonchi, wheezes or rubs ?GI: normal bowel sounds, no masses - has ruq tenderness ? ?BP 100/68   Pulse 71   Temp 98.3 ?F (36.8 ?C)   Ht '5\' 7"'  (1.702 m)   Wt 189 lb 3.2 oz (85.8 kg)   SpO2 98%   BMI 29.63 kg/m?  ?Wt Readings from Last 3 Encounters:  ?01/02/22 189 lb 3.2 oz (85.8 kg)  ?12/18/21 188 lb 6.4 oz (85.5 kg)  ?10/15/21 197 lb (89.4 kg)  ? ? ?Health Maintenance Due  ?Topic Date Due  ? HPV VACCINES (1 - 2-dose series) Never done  ? Hepatitis C Screening  Never done  ? TETANUS/TDAP  Never done  ? PAP-Cervical Cytology Screening  Never done  ? PAP SMEAR-Modifier  Never done  ? ? ?   ?Topic Date Due  ? HPV VACCINES (1 - 2-dose series) Never done  ? ? ? ?Lab Results  ?Component Value Date  ? TSH 0.716 10/10/2021  ? ?Lab Results  ?Component Value Date  ? WBC 5.7 12/18/2021  ? HGB 14.8 12/18/2021  ? HCT 43.4 12/18/2021  ? MCV 90 12/18/2021  ? PLT 278 12/18/2021  ? ?Lab Results  ?Component Value Date  ? NA 140 12/18/2021  ? K 4.7 12/18/2021  ? CO2 25 12/18/2021  ? GLUCOSE 84 12/18/2021  ? BUN 9 12/18/2021  ? CREATININE 0.95 12/18/2021  ? BILITOT 0.4 12/18/2021  ? ALKPHOS 57 12/18/2021  ? AST 25 12/18/2021  ? ALT 37 (H) 12/18/2021  ? PROT 7.2 12/18/2021  ? ALBUMIN 4.3 12/18/2021  ? CALCIUM 9.7 12/18/2021  ? ANIONGAP 10 04/14/2020  ? EGFR 85 12/18/2021  ? ?Lab Results  ?Component Value Date  ? CHOL 152 11/08/2021  ? ?Lab Results  ?Component Value Date  ? HDL 45 11/08/2021  ? ?Lab Results  ?Component Value Date  ? [place] 90 11/08/2021  ? ?Lab Results  ?Component Value Date  ? TRIG 89 11/08/2021  ? ?Lab Results  ?Component Value Date  ? CHOLHDL 3.4 11/08/2021  ? ?No results found for: HGBA1C ? ?   ?Assessment & Plan:  ? ?Problem List Items Addressed This Visit   ?None ?Visit Diagnoses   ? ? Right upper quadrant abdominal pain    -  Primary  ? Relevant Orders  ? H Pylori, IGM, IGG, IGA AB  ? Ambulatory referral to General Surgery  ? Nausea      ? Relevant Medications   ? ondansetron (ZOFRAN) 4 MG tablet  ? Other Relevant Orders  ? H Pylori, IGM, IGG, IGA AB  ? Ambulatory referral to General Surgery  ?  Adenomyosis of gallbladder      ? Relevant Orders  ? Ambulatory referral to General Surgery  ? ?  ? ?Meds ordered this encounter  ?Medications  ? ondansetron (ZOFRAN) 4 MG tablet  ?  Sig: Take 1 tablet (4 mg total) by mouth every 6 (six) hours as needed for nausea or vomiting.  ?  Dispense:  20 tablet  ?  Refill:  1  ?  Order Specific Question:   Supervising Provider  ?  AnswerRochel Brome [[id number]]  ? ? ?Orders Placed This Encounter  ?Procedures  ? H Pylori, IGM, IGG, IGA AB  ? Ambulatory referral to General Surgery  ?  ? ?Follow-up: Return if symptoms worsen or fail to improve. ? ?An After Visit Summary was printed and given to the patient. ? ?SARA R Devony Mcgrady, PA-C ?[place] ?[phone]

## 2022-01-04 LAB — H PYLORI, IGM, IGG, IGA AB
H pylori, IgM Abs: <9 units (ref 0.0–8.9)
H. pylori, IgA Abs: <9 units (ref 0.0–8.9)
H. pylori, IgG AbS: 0.09 Index Value (ref 0.00–0.79)

## 2022-01-08 DIAGNOSIS — L680 Hirsutism: Secondary | ICD-10-CM | POA: Diagnosis not present

## 2022-01-08 DIAGNOSIS — L309 Dermatitis, unspecified: Secondary | ICD-10-CM | POA: Diagnosis not present

## 2022-01-08 DIAGNOSIS — B070 Plantar wart: Secondary | ICD-10-CM | POA: Diagnosis not present

## 2022-01-08 DIAGNOSIS — L700 Acne vulgaris: Secondary | ICD-10-CM | POA: Diagnosis not present

## 2022-01-17 DIAGNOSIS — R1084 Generalized abdominal pain: Secondary | ICD-10-CM | POA: Diagnosis not present

## 2022-03-06 ENCOUNTER — Encounter: Payer: Self-pay | Admitting: Surgery

## 2022-03-22 ENCOUNTER — Encounter: Payer: Self-pay | Admitting: Gastroenterology

## 2022-03-22 ENCOUNTER — Ambulatory Visit: Payer: BC Managed Care – PPO | Admitting: Physician Assistant

## 2022-03-22 ENCOUNTER — Encounter: Payer: Self-pay | Admitting: Physician Assistant

## 2022-03-22 VITALS — BP 100/68 | HR 83 | Temp 97.3°F | Ht 67.0 in | Wt 188.6 lb

## 2022-03-22 DIAGNOSIS — K828 Other specified diseases of gallbladder: Secondary | ICD-10-CM | POA: Diagnosis not present

## 2022-03-22 DIAGNOSIS — F5102 Adjustment insomnia: Secondary | ICD-10-CM

## 2022-03-22 DIAGNOSIS — Z6829 Body mass index (BMI) 29.0-29.9, adult: Secondary | ICD-10-CM

## 2022-03-22 MED ORDER — TRAZODONE HCL 50 MG PO TABS: 25.0000 mg | ORAL_TABLET | Freq: Every evening | ORAL | 3 refills | Status: DC | PRN

## 2022-03-26 NOTE — Progress Notes (Signed)
Subjective:  Patient ID: Nicole Chase, female    DOB: 1995/10/02  Age: 26 y.o. MRN: [id number]  Chief Complaint  Patient presents with   Weight Gain   Insomnia    HPI  Pt initially comes in with complaints of insomnia - states she has trouble falling asleep.  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  She does have a toddler to care for through the week but does have help from grandparents.  She requests Lorrin Mais however I discussed that would not be good option for her since she is already on stimulants - both adderall XR and Adderall IR - she has been on these for the past 5 years and does not want to stop medication She says at night she has hard time 'turning off thoughts' and has also had a mild degree of depression/anxiety type symptoms as well She has tried benadryl, unisom and melatonin with little to no relief  Pt wanted to discuss starting weight loss medication like Wegovy however right now there is a shortage of that medication and she has been advised to see GI specialist for follow up of GI symptoms - she was having abdominal pain, GERD symptoms and nausea/vomiting.  Her gallbladder ultrasound showed adenomyomatosis and was referred to general surgeon but they did not advise cholycystectomy.  They did mention following up with GI for follow up of pancreatic tail cysts (pt with hx of Von Hippel Lindau) but pt has refused to schedule Pt would also not be candidate for adipex since she is on adderall as well Current Outpatient Medications on File Prior to Visit  Medication Sig Dispense Refill   amphetamine-dextroamphetamine (ADDERALL XR) 20 MG 24 hr capsule Take 1 capsule by mouth daily 30 capsule 0   amphetamine-dextroamphetamine (ADDERALL) 20 MG tablet Take 1 tablet by mouth daily 30 tablet 0   cimetidine (TAGAMET) 400 MG tablet Take 400 mg by mouth daily.     NUVARING 0.12-0.015 MG/24HR vaginal ring Place vaginally.     omeprazole  (PRILOSEC) 40 MG capsule Take 1 capsule (40 mg total) by mouth daily. 30 capsule 3   ondansetron (ZOFRAN) 4 MG tablet Take 1 tablet (4 mg total) by mouth every 6 (six) hours as needed for nausea or vomiting. 20 tablet 1   No current facility-administered medications on file prior to visit.   Past Medical History:  Diagnosis Date   Exertional dyspnea 12/29/2019   Gestational hypertension 12/29/2019   Hearing loss    Palpitations 12/29/2019   Pancreatic cyst    Pre-eclampsia    Pregnancy induced hypertension    VHL (von Hippel-Lindau syndrome) ([facility])    Past Surgical History:  Procedure Laterality Date   WISDOM TOOTH EXTRACTION      Family History  Problem Relation Age of Onset   Healthy Mother    Von Hippel-Lindau syndrome Mother    Hearing loss Mother    Healthy Father    Breast cancer Paternal Grandmother    Von Hippel-Lindau syndrome Maternal Uncle    Von Hippel-Lindau syndrome Maternal Grandmother    Social History   Socioeconomic History   Marital status: Single    Spouse name: Not on file   Number of children: 1   Years of education: Not on file   Highest education level: Not on file  Occupational History   Occupation: RN  Tobacco Use   Smoking status: Never   Smokeless tobacco: Never  Vaping  Use   Vaping Use: Never used  Substance and Sexual Activity   Alcohol use: Yes    Alcohol/week: 3.0 standard drinks of alcohol    Types: 3 Glasses of wine per week    Comment: ocassionally   Drug use: No   Sexual activity: Yes    Partners: Male    Birth control/protection: None  Other Topics Concern   Not on file  Social History Narrative   Not on file   Social Determinants of Health   Financial Resource Strain: Not on file  Food Insecurity: Not on file  Transportation Needs: Not on file  Physical Activity: Not on file  Stress: Not on file  Social Connections: Not on file    Review of Systems CONSTITUTIONAL: see HPI E/N/T: Negative for ear pain, nasal  congestion and sore throat.  CARDIOVASCULAR: Negative for chest pain, dizziness, palpitations and pedal edema.  RESPIRATORY: Negative for recent cough and dyspnea.  GASTROINTESTINAL: Negative for abdominal pain, acid reflux symptoms, constipation, diarrhea, nausea and vomiting.  MSK: Negative for arthralgias and myalgias.  INTEGUMENTARY: Negative for rash.  NEUROLOGICAL: Negative for dizziness and headaches.  PSYCHIATRIC: see HPI      Objective:  PHYSICAL EXAM:   VS: BP 100/68 (BP Location: Left Arm, Patient Position: Sitting, Cuff Size: Normal)   Pulse 83   Temp (!) 97.3 F (36.3 C) (Temporal)   Ht '5\' 7"'$  (1.702 m)   Wt 188 lb 9.6 oz (85.5 kg)   SpO2 94%   BMI 29.54 kg/m   GEN: Well nourished, well developed, in no acute distress   Cardiac: RRR; no murmurs, rubs, or gallops,no edema - Respiratory:  normal respiratory rate and pattern with no distress - normal breath sounds with no rales, rhonchi, wheezes or rubs  Skin: warm and dry, no rash   Psych: euthymic mood, appropriate affect and demeanor   Lab Results  Component Value Date   WBC 5.7 12/18/2021   HGB 14.8 12/18/2021   HCT 43.4 12/18/2021   PLT 278 12/18/2021   GLUCOSE 84 12/18/2021   CHOL 152 11/08/2021   TRIG 89 11/08/2021   HDL 45 11/08/2021   LDLCALC 90 11/08/2021   ALT 37 (H) 12/18/2021   AST 25 12/18/2021   NA 140 12/18/2021   K 4.7 12/18/2021   CL 102 12/18/2021   CREATININE 0.95 12/18/2021   BUN 9 12/18/2021   CO2 25 12/18/2021   TSH 0.716 10/10/2021      Assessment & Plan:   Problem List Items Addressed This Visit   None Visit Diagnoses     Adjustment insomnia    -  Primary   Relevant Medications   traZODone (DESYREL) 50 MG tablet   Adenomyosis of gallbladder     Recommend follow up with GI - pt defers at this time  Body mass 29-30 Recommend decrease carbs in diet - low fat diet     .  Meds ordered this encounter  Medications   traZODone (DESYREL) 50 MG tablet    Sig: Take  0.5-1 tablets (25-50 mg total) by mouth at bedtime as needed for sleep.    Dispense:  30 tablet    Refill:  3    Order Specific Question:   Supervising Provider    Answer:   Shelton Silvas    No orders of the defined types were placed in this encounter.    Follow-up: Return in about 3 months (around 06/22/2022) for chronic follow up.  An After Visit Summary  was printed and given to the patient.  SARA R Shearon Clonch, PA-C [facility] [phone] 

## 2022-04-17 DIAGNOSIS — Z01419 Encounter for gynecological examination (general) (routine) without abnormal findings: Secondary | ICD-10-CM | POA: Diagnosis not present

## 2022-04-23 ENCOUNTER — Ambulatory Visit (INDEPENDENT_AMBULATORY_CARE_PROVIDER_SITE_OTHER): Payer: BC Managed Care – PPO | Admitting: Gastroenterology

## 2022-04-23 ENCOUNTER — Encounter: Payer: Self-pay | Admitting: Gastroenterology

## 2022-04-23 VITALS — BP 110/74 | HR 100 | Ht 68.0 in | Wt 189.4 lb

## 2022-04-23 DIAGNOSIS — R1013 Epigastric pain: Secondary | ICD-10-CM

## 2022-04-23 DIAGNOSIS — R1011 Right upper quadrant pain: Secondary | ICD-10-CM

## 2022-04-23 DIAGNOSIS — Q8583 Von Hippel-Lindau syndrome: Secondary | ICD-10-CM | POA: Diagnosis not present

## 2022-04-23 DIAGNOSIS — K862 Cyst of pancreas: Secondary | ICD-10-CM | POA: Diagnosis not present

## 2022-04-23 DIAGNOSIS — D135 Benign neoplasm of extrahepatic bile ducts: Secondary | ICD-10-CM

## 2022-04-23 DIAGNOSIS — R110 Nausea: Secondary | ICD-10-CM

## 2022-04-23 NOTE — Progress Notes (Signed)
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  - 10/05/2012: CT C/A/P: Normal liver, GB, spleen.  Few subcentimeter cysts in the distal pancreatic tail.  Normal GI tract.  Few small bilateral inguinal nodes  - 12/18/2021: ALT 37 (and downtrending), otherwise normal CMP.  Normal CBC, amylase, lipase, H. pylori serologies - 12/24/2021: RUQ [place]: Focal adenomyomatosis of GB wall.  CBD 3 mm increased hepatic echogenicity consistent with hepatic steatosis  Sxs started in April with MEG pain and n/v. Started omeprazole w/ improvement in nausea. Still with nocturnal nausea, worse with lying on right side. Will use Zofran prn.   Was evaluated in the surgical clinic on 01/17/2022 and advised against cholecystectomy.  Recommended follow-up in the GI clinic for evaluation of pancreatic tail cyst and GI symptoms.     Latest Ref Rng & Units 12/18/2021   10:56 AM 10/10/2021   11:25 AM 08/16/2020    4:01 PM  CBC  WBC 3.4 - 10.8 x10E3/uL 5.7  8.5  10.9   Hemoglobin 11.1 - 15.9 g/dL 14.8  15.3  15.3   Hematocrit 34.0 - 46.6 % 43.4  45.3  45.3   Platelets 150 - 450 x10E3/uL 278  293  364         Latest Ref Rng & Units 12/18/2021   10:56 AM 11/08/2021   10:28 AM 10/10/2021   11:25 AM  CMP  Glucose 70 - 99 mg/dL 84  96  71   BUN 6 - 20 mg/dL '9  10  16   '$ Creatinine 0.57 - 1.00 mg/dL 0.95  0.84  0.88   Sodium 134 - 144 mmol/L 140  141  140   Potassium 3.5 - 5.2 mmol/L 4.7  4.3  4.8   Chloride 96 - 106 mmol/L 102  107  101   CO2 20 - 29 mmol/L '25  24  21   '$ Calcium 8.7 - 10.2 mg/dL 9.7  9.7  10.2   Total Protein 6.0 - 8.5 g/dL 7.2  7.1  8.1   Total Bilirubin 0.0 - 1.2 mg/dL 0.4  <0.2  0.4   Alkaline Phos 44 - 121 IU/L 57  54  57    AST 0 - 40 IU/L 25  32  65   ALT 0 - 32 IU/L 37  77  93       Past Medical History:  Diagnosis Date   Exertional dyspnea 12/29/2019   Gestational hypertension 12/29/2019   Hearing loss    Palpitations 12/29/2019   Pancreatic cyst    Pre-eclampsia    Pregnancy induced hypertension    VHL (von Hippel-Lindau syndrome) ([place])      Past Surgical History:  Procedure Laterality Date   WISDOM TOOTH EXTRACTION     Family History  Problem Relation Age of Onset   Healthy Mother    Von Hippel-Lindau syndrome Mother    Hearing loss Mother    Healthy Father    Von Hippel-Lindau syndrome Maternal Grandmother    Breast cancer Paternal Grandmother    Von Hippel-Lindau syndrome Maternal Uncle    Von Hippel-Lindau syndrome Maternal Aunt    Social History   Tobacco  Use   Smoking status: Never   Smokeless tobacco: Never  Vaping Use   Vaping Use: Never used  Substance Use Topics   Alcohol use: Yes    Alcohol/week: 3.0 standard drinks of alcohol    Types: 3 Glasses of wine per week    Comment: ocassionally   Drug use: No   Current Outpatient Medications  Medication Sig Dispense Refill   amphetamine-dextroamphetamine (ADDERALL XR) 20 MG 24 hr capsule Take 1 capsule by mouth daily 30 capsule 0   amphetamine-dextroamphetamine (ADDERALL) 20 MG tablet Take 1 tablet by mouth daily 30 tablet 0   NUVARING 0.12-0.015 MG/24HR vaginal ring Place vaginally.     omeprazole (PRILOSEC) 40 MG capsule Take 1 capsule (40 mg total) by mouth daily. 30 capsule 3   ondansetron (ZOFRAN) 4 MG tablet Take 1 tablet (4 mg total) by mouth every 6 (six) hours as needed for nausea or vomiting. 20 tablet 1   traZODone (DESYREL) 50 MG tablet Take 0.5-1 tablets (25-50 mg total) by mouth at bedtime as needed for sleep. 30 tablet 3   cimetidine (TAGAMET) 400 MG tablet Take 400 mg by mouth daily. (Patient not taking: Reported on 04/23/2022)     No current facility-administered medications for this visit.   Allergies   Allergen Reactions   Keflex [Cephalexin] Hives     Review of Systems: All systems reviewed and negative except where noted in HPI.     Physical Exam:    Wt Readings from Last 3 Encounters:  04/23/22 189 lb 6 oz (85.9 kg)  03/22/22 188 lb 9.6 oz (85.5 kg)  01/02/22 189 lb 3.2 oz (85.8 kg)    BP 110/74 (BP Location: Left Arm, Patient Position: Sitting, Cuff Size: Normal)   Pulse 100   Ht '5\' 8"'$  (1.727 m)   Wt 189 lb 6 oz (85.9 kg)   LMP 04/15/2022 (Approximate) Comment: nuvaring  BMI 28.79 kg/m  Constitutional:  Pleasant, in no acute distress. Psychiatric: Normal mood and affect. Behavior is normal. Cardiovascular: Normal rate, regular rhythm. No edema Pulmonary/chest: Effort normal and breath sounds normal. No wheezing, rales or rhonchi. Abdominal: Soft, nondistended, nontender. Bowel sounds active throughout. There are no masses palpable. No hepatomegaly. Neurological: Alert and oriented to person place and time. Skin: Skin is warm and dry. No rashes noted.   ASSESSMENT AND PLAN;   1) von Hippel-Lindau syndrome 2) History of pancreatic cysts Pancreatic lesions are common in VHL, with the most common being pancreatic cysts, and less commonly serous cystadenoma's and NETs.  This was last seen on imaging on CT in 2014.  Conceivable that if cyst increases in size or depending on location, could be contributory to her upper GI symptoms. - MRI/MRCP  3) Epigastric pain 4) RUQ pain 5) Nausea - Discussed broad DDx to include any relationship with pancreatic etiology as above, along with PUD, gastritis, atypical reflux symptoms - Continue omeprazole - Discussed role/utility of EGD.  She would like to proceed with EGD if MRI/MRCP is otherwise unrevealing  6) Adenomyomatosis - Was evaluated in the surgical clinic and recommended against ccy at this time - Can evaluate GB at time of MRI/MRCP as above - Consider repeat ultrasound in 1 year for surveillance depending on MRI  findings    Lavena Bullion, DO, FACG  04/23/2022, 2:48 PM   Marge Duncans, PA-C

## 2022-04-23 NOTE — Patient Instructions (Addendum)
If you are age 26 or younger, your body mass index should be between 19-25. Your Body mass index is 28.79 kg/m. If this is out of the aformentioned range listed, please consider follow up with your Primary Care Provider.  ________________________________________________________  The [facility] providers would like to encourage you to use [facility] to communicate with providers for non-urgent requests or questions.  Due to long hold times on the telephone, sending your provider a message by [facility] may be a faster and more efficient way to get a response.  Please allow 48 business hours for a response.  Please remember that this is for non-urgent requests.  _______________________________________________________  Nicole Chase have been scheduled for an MRI at [facility] on 05/02/2022. Your appointment time is 8:00 am. Please arrive to admitting (at main entrance of the hospital) 15 minutes prior to your appointment time for registration purposes. Please make certain not to have anything to eat or drink 6 hours prior to your test. In addition, if you have any metal in your body, have a pacemaker or defibrillator, please be sure to let your ordering physician know. This test typically takes 45 minutes to 1 hour to complete. Should you need to reschedule, please call [phone] to do so.  We will call you with your results to discuss your follow up  Thank you for entrusting me with your care and choosing [facility].  Dr Bryan Lemma

## 2022-05-02 ENCOUNTER — Other Ambulatory Visit (HOSPITAL_COMMUNITY): Payer: BC Managed Care – PPO

## 2022-05-08 ENCOUNTER — Other Ambulatory Visit: Payer: Self-pay | Admitting: Gastroenterology

## 2022-05-08 ENCOUNTER — Ambulatory Visit (HOSPITAL_COMMUNITY)
Admission: RE | Admit: 2022-05-08 | Discharge: 2022-05-08 | Disposition: A | Discharge status: Home / Self Care | Payer: BC Managed Care – PPO | Source: Ambulatory Visit | Attending: Gastroenterology | Admitting: Gastroenterology

## 2022-05-08 DIAGNOSIS — K862 Cyst of pancreas: Secondary | ICD-10-CM | POA: Insufficient documentation

## 2022-05-08 DIAGNOSIS — R1013 Epigastric pain: Secondary | ICD-10-CM

## 2022-05-08 DIAGNOSIS — Q8583 Von Hippel-Lindau syndrome: Secondary | ICD-10-CM | POA: Diagnosis not present

## 2022-05-08 DIAGNOSIS — K760 Fatty (change of) liver, not elsewhere classified: Secondary | ICD-10-CM | POA: Diagnosis not present

## 2022-05-08 DIAGNOSIS — R110 Nausea: Secondary | ICD-10-CM | POA: Diagnosis not present

## 2022-05-08 DIAGNOSIS — R1011 Right upper quadrant pain: Secondary | ICD-10-CM | POA: Diagnosis not present

## 2022-05-08 DIAGNOSIS — K8689 Other specified diseases of pancreas: Secondary | ICD-10-CM | POA: Diagnosis not present

## 2022-05-08 DIAGNOSIS — R935 Abnormal findings on diagnostic imaging of other abdominal regions, including retroperitoneum: Secondary | ICD-10-CM | POA: Diagnosis not present

## 2022-05-08 MED ORDER — GADOBUTROL 1 MMOL/ML IV SOLN
8.5000 mL | Freq: Once | INTRAVENOUS | Status: AC | PRN
  Administered 2022-05-08: 8.5 mL via INTRAVENOUS

## 2022-05-29 DIAGNOSIS — F900 Attention-deficit hyperactivity disorder, predominantly inattentive type: Secondary | ICD-10-CM | POA: Diagnosis not present

## 2022-06-26 ENCOUNTER — Ambulatory Visit: Payer: BC Managed Care – PPO | Admitting: Physician Assistant

## 2022-06-26 ENCOUNTER — Encounter: Payer: Self-pay | Admitting: Physician Assistant

## 2022-06-26 VITALS — BP 112/76 | HR 84 | Temp 97.1°F | Ht 68.0 in | Wt 193.6 lb

## 2022-06-26 DIAGNOSIS — N2889 Other specified disorders of kidney and ureter: Secondary | ICD-10-CM | POA: Diagnosis not present

## 2022-06-26 DIAGNOSIS — F5102 Adjustment insomnia: Secondary | ICD-10-CM | POA: Diagnosis not present

## 2022-06-26 DIAGNOSIS — Q8583 Von Hippel-Lindau syndrome: Secondary | ICD-10-CM | POA: Diagnosis not present

## 2022-06-26 DIAGNOSIS — K828 Other specified diseases of gallbladder: Secondary | ICD-10-CM | POA: Insufficient documentation

## 2022-06-26 DIAGNOSIS — R5383 Other fatigue: Secondary | ICD-10-CM | POA: Diagnosis not present

## 2022-06-26 MED ORDER — TRAZODONE HCL 50 MG PO TABS: 25.0000 mg | ORAL_TABLET | Freq: Every evening | ORAL | 1 refills | Status: DC | PRN

## 2022-06-26 NOTE — Progress Notes (Signed)
Subjective:  Patient ID: Nicole Chase, female    DOB: January 01, 1996  Age: 26 y.o. MRN: [id number]  Chief Complaint  Patient presents with   Insomnia    HPI  Pt in initially for follow up of insomnia and treatment with trazadone.  She states when she takes the medication it does help her sleep however she only takes the medication when she feels she will be able to get a full 6-7 hours of sleep.  She has changed her work schedule and now working 11-7 Tuesday through Friday but is also picking up day shifts and weekends  Also at last visit pt was referred to GI Dr Bryan Lemma for further evaluation of pancreatic cysts as well as adenomyosis of gallbladder.  She had a MRI done on 05/01/22 and states that she had abnormal results. I reviewed the records since I had not received any correspondence and it was noted that she has innumerous pancreatic cysts however none were worrisome.  However she also had 3 new renal lesions since last exam in 2013 noted that are worrisome for small clear cell renal cell carcinoma.  Pt states she was advised to follow up with urology but she states that she had chosen not to do that and working on a referral to [place] to monitor her VHL in [place].(Because she believes these lesions are due to that and does not want to incur cost of urologist /testing/biopies etc) I discussed with pt to continue with that follow up but if there is a possibility of this being renal cell carcinoma a timely evaluation/diagnosis/treatment needed to be implemented ASAP -- she does then agree to urology referral to discuss further and I will make that appt Current Outpatient Medications on File Prior to Visit  Medication Sig Dispense Refill   amphetamine-dextroamphetamine (ADDERALL XR) 20 MG 24 hr capsule Take 1 capsule by mouth daily 30 capsule 0   amphetamine-dextroamphetamine (ADDERALL) 20 MG tablet Take 1 tablet by mouth daily 30 tablet 0   NUVARING 0.12-0.015 MG/24HR  vaginal ring Place vaginally.     omeprazole (PRILOSEC) 40 MG capsule Take 1 capsule (40 mg total) by mouth daily. 30 capsule 3   ondansetron (ZOFRAN) 4 MG tablet Take 1 tablet (4 mg total) by mouth every 6 (six) hours as needed for nausea or vomiting. 20 tablet 1   No current facility-administered medications on file prior to visit.   Past Medical History:  Diagnosis Date   Exertional dyspnea 12/29/2019   Gestational hypertension 12/29/2019   Hearing loss    Palpitations 12/29/2019   Pancreatic cyst    Pre-eclampsia    Pregnancy induced hypertension    VHL (von Hippel-Lindau syndrome) ([facility])    Past Surgical History:  Procedure Laterality Date   WISDOM TOOTH EXTRACTION      Family History  Problem Relation Age of Onset   Healthy Mother    Von Hippel-Lindau syndrome Mother    Hearing loss Mother    Healthy Father    Von Hippel-Lindau syndrome Maternal Grandmother    Breast cancer Paternal Grandmother    Von Hippel-Lindau syndrome Maternal Uncle    Von Hippel-Lindau syndrome Maternal Aunt    Social History   Socioeconomic History   Marital status: Single    Spouse name: Not on file   Number of children: 1   Years of education: Not on file   Highest education level: Not on file  Occupational History   Occupation: Therapist, sports  Tobacco Use  Smoking status: Never   Smokeless tobacco: Never  Vaping Use   Vaping Use: Never used  Substance and Sexual Activity   Alcohol use: Yes    Alcohol/week: 3.0 standard drinks of alcohol    Types: 3 Glasses of wine per week    Comment: ocassionally   Drug use: No   Sexual activity: Yes    Partners: Male    Birth control/protection: None  Other Topics Concern   Not on file  Social History Narrative   Not on file   Social Determinants of Health   Financial Resource Strain: Not on file  Food Insecurity: Not on file  Transportation Needs: Not on file  Physical Activity: Not on file  Stress: Not on file  Social Connections: Not on file     Review of Systems  CONSTITUTIONAL: Negative for chills, fatigue, fever, unintentional weight gain and unintentional weight loss.  CARDIOVASCULAR: Negative for chest pain, dizziness, RESPIRATORY: Negative for recent cough and dyspnea.  GASTROINTESTINAL: Negative for abdominal pain, acid reflux symptoms, constipation, diarrhea, nausea and vomiting.  GU - see HPI PSYCHIATRIC: Negative for sleep disturbance and to question depression screen.  Negative for depression, negative for anhedonia.      Objective:  PHYSICAL EXAM:   VS: BP 112/76 (BP Location: Left Arm, Patient Position: Sitting, Cuff Size: Normal)   Pulse 84   Temp (!) 97.1 F (36.2 C) (Temporal)   Ht '5\' 8"'$  (1.727 m)   Wt 193 lb 9.6 oz (87.8 kg)   SpO2 99%   BMI 29.44 kg/m   GEN: Well nourished, well developed, in no acute distress  Cardiac: RRR; no murmurs, rubs, or gallops,no edema  Respiratory:  normal respiratory rate and pattern with no distress - normal breath sounds with no rales, rhonchi, wheezes or rubs  Skin: warm and dry, no rash   Psych: euthymic mood, appropriate affect and demeanor   Lab Results  Component Value Date   WBC 5.7 12/18/2021   HGB 14.8 12/18/2021   HCT 43.4 12/18/2021   PLT 278 12/18/2021   GLUCOSE 84 12/18/2021   CHOL 152 11/08/2021   TRIG 89 11/08/2021   HDL 45 11/08/2021   LDLCALC 90 11/08/2021   ALT 37 (H) 12/18/2021   AST 25 12/18/2021   NA 140 12/18/2021   K 4.7 12/18/2021   CL 102 12/18/2021   CREATININE 0.95 12/18/2021   BUN 9 12/18/2021   CO2 25 12/18/2021   TSH 0.716 10/10/2021      Assessment & Plan:   Problem List Items Addressed This Visit       Digestive   Adenomyosis of gallbladder Continue follow up with GI     Nervous and Auditory   VHL (von Hippel-Lindau syndrome) ([place]) Pt making own appt with [place] in [place] for follow up     Other   Other fatigue   Relevant Orders   CBC with Differential/Platelet   Comprehensive  metabolic panel   TSH   Adjustment insomnia   Relevant Medications   traZODone (DESYREL) 50 MG tablet   Bilateral renal masses - Primary Referral to [facility]  .  Meds ordered this encounter  Medications   traZODone (DESYREL) 50 MG tablet    Sig: Take 0.5-1 tablets (25-50 mg total) by mouth at bedtime as needed for sleep.    Dispense:  90 tablet    Refill:  1    Order Specific Question:   Supervising Provider    Answer:  COX, Elnita Maxwell [[id number]]    Orders Placed This Encounter  Procedures   CBC with Differential/Platelet   Comprehensive metabolic panel   TSH   Ambulatory referral to Urology     Follow-up: Return in about 6 months (around 12/26/2022) for chronic follow up visit.  An After Visit Summary was printed and given to the patient.  Yetta Flock [facility] [phone]

## 2022-06-27 LAB — CBC WITH DIFFERENTIAL/PLATELET
Basophils Absolute: 0.1 10*3/uL (ref 0.0–0.2)
Basos: 1 %
EOS (ABSOLUTE): 0.2 10*3/uL (ref 0.0–0.4)
Eos: 1 %
Hematocrit: 42.7 % (ref 34.0–46.6)
Hemoglobin: 14.4 g/dL (ref 11.1–15.9)
Immature Grans (Abs): 0 10*3/uL (ref 0.0–0.1)
Immature Granulocytes: 0 %
Lymphocytes Absolute: 3.6 10*3/uL — ABNORMAL HIGH (ref 0.7–3.1)
Lymphs: 35 %
MCH: 30.1 pg (ref 26.6–33.0)
MCHC: 33.7 g/dL (ref 31.5–35.7)
MCV: 89 fL (ref 79–97)
Monocytes Absolute: 0.7 10*3/uL (ref 0.1–0.9)
Monocytes: 7 %
Neutrophils Absolute: 5.8 10*3/uL (ref 1.4–7.0)
Neutrophils: 56 %
Platelets: 312 10*3/uL (ref 150–450)
RBC: 4.78 x10E6/uL (ref 3.77–5.28)
RDW: 11.9 % (ref 11.7–15.4)
WBC: 10.4 10*3/uL (ref 3.4–10.8)

## 2022-06-27 LAB — COMPREHENSIVE METABOLIC PANEL
ALT: 76 IU/L — ABNORMAL HIGH (ref 0–32)
AST: 47 IU/L — ABNORMAL HIGH (ref 0–40)
Albumin/Globulin Ratio: 1.5 (ref 1.2–2.2)
Albumin: 4.5 g/dL (ref 4.0–5.0)
Alkaline Phosphatase: 70 IU/L (ref 44–121)
BUN/Creatinine Ratio: 14 (ref 9–23)
BUN: 12 mg/dL (ref 6–20)
Bilirubin Total: <0.2 mg/dL (ref 0.0–1.2)
CO2: 25 mmol/L (ref 20–29)
Calcium: 10 mg/dL (ref 8.7–10.2)
Chloride: 97 mmol/L (ref 96–106)
Creatinine, Ser: 0.84 mg/dL (ref 0.57–1.00)
Globulin, Total: 3 g/dL (ref 1.5–4.5)
Glucose: 126 mg/dL — ABNORMAL HIGH (ref 70–99)
Potassium: 4.6 mmol/L (ref 3.5–5.2)
Sodium: 136 mmol/L (ref 134–144)
Total Protein: 7.5 g/dL (ref 6.0–8.5)
eGFR: 98 mL/min/{1.73_m2} (ref >59)

## 2022-06-27 LAB — TSH: TSH: 1.16 u[IU]/mL (ref 0.450–4.500)

## 2022-07-08 DIAGNOSIS — N2889 Other specified disorders of kidney and ureter: Secondary | ICD-10-CM | POA: Diagnosis not present

## 2022-11-01 ENCOUNTER — Telehealth: Payer: Self-pay

## 2022-11-01 ENCOUNTER — Telehealth: Payer: BC Managed Care – PPO | Admitting: Physician Assistant

## 2022-11-01 DIAGNOSIS — J039 Acute tonsillitis, unspecified: Secondary | ICD-10-CM | POA: Diagnosis not present

## 2022-11-01 MED ORDER — AZITHROMYCIN 250 MG PO TABS: ORAL_TABLET | ORAL | 0 refills | Status: AC

## 2022-11-01 NOTE — Progress Notes (Signed)
Virtual Visit Consent   Nicole Chase, you are scheduled for a virtual visit with a [facility] provider today. Just as with appointments in the office, your consent must be obtained to participate. Your consent will be active for this visit and any virtual visit you may have with one of our providers in the next 365 days. If you have a MyChart account, a copy of this consent can be sent to you electronically.  As this is a virtual visit, video technology does not allow for your provider to perform a traditional examination. This may limit your provider's ability to fully assess your condition. If your provider identifies any concerns that need to be evaluated in person or the need to arrange testing (such as labs, EKG, etc.), we will make arrangements to do so. Although advances in technology are sophisticated, we cannot ensure that it will always work on either your end or our end. If the connection with a video visit is poor, the visit may have to be switched to a telephone visit. With either a video or telephone visit, we are not always able to ensure that we have a secure connection.  By engaging in this virtual visit, you consent to the provision of healthcare and authorize for your insurance to be billed (if applicable) for the services provided during this visit. Depending on your insurance coverage, you may receive a charge related to this service.  I need to obtain your verbal consent now. Are you willing to proceed with your visit today? Nicole Chase has provided verbal consent on 11/01/2022 for a virtual visit (video or telephone). Mar Daring, PA-C  Date: 11/01/2022 9:37 AM  Virtual Visit via Video Note   I, Mar Daring, connected with  Nicole Chase  ([id number], 1995-09-12) on 11/01/22 at  9:30 AM EST by a video-enabled telemedicine application and verified that I am speaking with the correct person using two identifiers.  Location: Patient: Virtual Visit  Location Patient: Home Provider: Virtual Visit Location Provider: Home Office   I discussed the limitations of evaluation and management by telemedicine and the availability of in person appointments. The patient expressed understanding and agreed to proceed.    History of Present Illness: Nicole Chase is a 27 y.o. who identifies as a female who was assigned female at birth, and is being seen today for sore throat, ear pain.  HPI: Sore Throat  This is a new problem. The current episode started yesterday. The problem has been gradually worsening. There has been no fever. Associated symptoms include congestion, coughing (mild), ear pain (radiating from throat), headaches, swollen glands and trouble swallowing. Pertinent negatives include no diarrhea, ear discharge, hoarse voice, plugged ear sensation, neck pain, shortness of breath, stridor or vomiting. Associated symptoms comments: Sinus congestion, nasal congestion, post nasal drainage. She has had exposure to strep. She has had no exposure to mono. Exposure to: daughter. She has tried acetaminophen (zyrtec, sudafed, ibuprofen) for the symptoms. The treatment provided no relief.     Problems:  Patient Active Problem List   Diagnosis Date Noted   Adjustment insomnia 06/26/2022   Bilateral renal masses 06/26/2022   Adenomyosis of gallbladder 06/26/2022   VHL (von Hippel-Lindau syndrome) ([place])    Pancreatic cyst    Missed period 08/16/2020   Other fatigue 08/16/2020   Acute pain of right shoulder 08/16/2020   SVD (spontaneous vaginal delivery) 8/7 04/15/2020   Perineal laceration, second degree  04/15/2020   Postpartum care following  vaginal delivery 8/7 04/15/2020   Encounter for induction of labor 04/14/2020   Gestational hypertension 12/29/2019   Palpitations 12/29/2019   Exertional dyspnea 12/29/2019   Tachycardia 12/27/2019   Elevated BP without diagnosis of hypertension 12/27/2019    Allergies:  Allergies  Allergen Reactions    Keflex [Cephalexin] Hives   Medications:  Current Outpatient Medications:    azithromycin (ZITHROMAX) 250 MG tablet, Take 2 tablets on day 1, then 1 tablet daily on days 2 through 5, Disp: 6 tablet, Rfl: 0   amphetamine-dextroamphetamine (ADDERALL XR) 20 MG 24 hr capsule, Take 1 capsule by mouth daily, Disp: 30 capsule, Rfl: 0   amphetamine-dextroamphetamine (ADDERALL) 20 MG tablet, Take 1 tablet by mouth daily, Disp: 30 tablet, Rfl: 0   NUVARING 0.12-0.015 MG/24HR vaginal ring, Place vaginally., Disp: , Rfl:    omeprazole (PRILOSEC) 40 MG capsule, Take 1 capsule (40 mg total) by mouth daily., Disp: 30 capsule, Rfl: 3   ondansetron (ZOFRAN) 4 MG tablet, Take 1 tablet (4 mg total) by mouth every 6 (six) hours as needed for nausea or vomiting., Disp: 20 tablet, Rfl: 1   traZODone (DESYREL) 50 MG tablet, Take 0.5-1 tablets (25-50 mg total) by mouth at bedtime as needed for sleep., Disp: 90 tablet, Rfl: 1  Observations/Objective: Patient is well-developed, well-nourished in no acute distress.  Resting comfortably at home.  Head is normocephalic, atraumatic.  No labored breathing.  Speech is clear and coherent with logical content.  Patient is alert and oriented at baseline.    Assessment and Plan: 1. Acute tonsillitis, unspecified etiology - azithromycin (ZITHROMAX) 250 MG tablet; Take 2 tablets on day 1, then 1 tablet daily on days 2 through 5  Dispense: 6 tablet; Refill: 0  - Suspect tonsillitis, recurrent issue patient gets once or twice per year - Zpack prescribed - Tylenol and Ibuprofen alternating every 4 hours - Salt water gargles - Chloraseptic spray - Liquid and soft food diet - Push fluids - New toothbrush in 3 days - Seek in person evaluation if not improving or if symptoms worsen   Follow Up Instructions: I discussed the assessment and treatment plan with the patient. The patient was provided an opportunity to ask questions and all were answered. The patient agreed  with the plan and demonstrated an understanding of the instructions.  A copy of instructions were sent to the patient via MyChart unless otherwise noted below.    The patient was advised to call back or seek an in-person evaluation if the symptoms worsen or if the condition fails to improve as anticipated.  Time:  I spent 10 minutes with the patient via telehealth technology discussing the above problems/concerns.    Mar Daring, PA-C

## 2022-11-01 NOTE — Telephone Encounter (Signed)
Patient called stated she was having ST and needed to be seen.  Recommended patient go be seen at urgent care or my chart virtual visit.

## 2022-11-01 NOTE — Patient Instructions (Signed)
Lawrence Santiago, thank you for joining Mar Daring, PA-C for today's virtual visit.  While this provider is not your primary care provider (PCP), if your PCP is located in our provider database this encounter information will be shared with them immediately following your visit.   [facility] account gives you access to today's visit and all your visits, tests, and labs performed at [facility] " click here if you don't have a [facility] account or go to [email or web address]  Consent: (Patient) TENEAL SHRYOCK provided verbal consent for this virtual visit at the beginning of the encounter.  Current Medications:  Current Outpatient Medications:    azithromycin (ZITHROMAX) 250 MG tablet, Take 2 tablets on day 1, then 1 tablet daily on days 2 through 5, Disp: 6 tablet, Rfl: 0   amphetamine-dextroamphetamine (ADDERALL XR) 20 MG 24 hr capsule, Take 1 capsule by mouth daily, Disp: 30 capsule, Rfl: 0   amphetamine-dextroamphetamine (ADDERALL) 20 MG tablet, Take 1 tablet by mouth daily, Disp: 30 tablet, Rfl: 0   NUVARING 0.12-0.015 MG/24HR vaginal ring, Place vaginally., Disp: , Rfl:    omeprazole (PRILOSEC) 40 MG capsule, Take 1 capsule (40 mg total) by mouth daily., Disp: 30 capsule, Rfl: 3   ondansetron (ZOFRAN) 4 MG tablet, Take 1 tablet (4 mg total) by mouth every 6 (six) hours as needed for nausea or vomiting., Disp: 20 tablet, Rfl: 1   traZODone (DESYREL) 50 MG tablet, Take 0.5-1 tablets (25-50 mg total) by mouth at bedtime as needed for sleep., Disp: 90 tablet, Rfl: 1   Medications ordered in this encounter:  Meds ordered this encounter  Medications   azithromycin (ZITHROMAX) 250 MG tablet    Sig: Take 2 tablets on day 1, then 1 tablet daily on days 2 through 5    Dispense:  6 tablet    Refill:  0    Order Specific Question:   Supervising Provider    Answer:   Chase Picket [id number]     *If you need refills on other medications prior  to your next appointment, please contact your pharmacy*  Follow-Up: Call back or seek an in-person evaluation if the symptoms worsen or if the condition fails to improve as anticipated.  [place] [phone]  Other Instructions  Tonsillitis  Tonsillitis is an infection of the throat that causes the tonsils to become red, tender, and swollen. Tonsils are tissues in the back of your throat. Each tonsil has crevices (crypts). Tonsils normally work to protect the body from infection. What are the causes? Sudden (acute) tonsillitis may be caused by a virus or bacteria, including streptococcal bacteria. Long-lasting (chronic) tonsillitis occurs when the crypts of the tonsils become filled with pieces of food and bacteria, which makes it easy for the tonsils to become repeatedly infected. Tonsillitis can be spread from person to person when it is caused by a virus or bacteria. It may be spread by inhaling droplets that are released with coughing or sneezing. You may also come into contact with viruses or bacteria on surfaces, such as cups or utensils. What are the signs or symptoms? Symptoms of this condition include: A sore throat. This may include trouble swallowing. White patches on the tonsils. Swollen tonsils. Fever. Headache. Tiredness. Loss of appetite. Snoring during sleep when you did not snore before. Small, foul-smelling, yellowish-white pieces of material (tonsilloliths) that you occasionally cough up or spit out. These can cause you to have bad breath. How is  this diagnosed? This condition is diagnosed with a physical exam. Diagnosis can be confirmed with the results of lab tests, including a throat culture. How is this treated? Treatment for this condition depends on the cause, but usually focuses on treating the symptoms associated with it. Treatment may include: Medicines to relieve pain and manage fever. Steroid medicines to reduce swelling. Antibiotic  medicines if the condition is caused by bacteria. If episodes of tonsillitis are severe and frequent, your health care provider may recommend surgery to remove the tonsils (tonsillectomy). Follow these instructions at home: Medicines Take over-the-counter and prescription medicines only as told by your health care provider. If you were prescribed an antibiotic medicine, take it as told by your health care provider. Do not stop taking the antibiotic even if you start to feel better. Eating and drinking Drink enough fluid to keep your urine pale yellow. While your throat is sore, eat soft or liquid foods, such as sherbet, soups, or soft, warm cereals, such as oatmeal or hot wheat cereal. Drink warm liquids. Eat frozen ice pops. General instructions Rest as much as possible and get plenty of sleep. Gargle with a mixture of salt and water 3-4 times a day or as needed. To make salt water, completely dissolve -1 tsp (3-6 g) of salt in 1 cup (237 mL) of warm water. Do not swallow the mixture of salt and water. Wash your hands regularly with soap and water for at least 20 seconds. If soap and water are not available, use hand sanitizer. Do not share cups, bottles, or other utensils until your symptoms have gone away. Do not use any products that contain nicotine or tobacco. These products include cigarettes, chewing tobacco, and vaping devices, such as e-cigarettes. If you need help quitting, ask your health care provider. Keep all follow-up visits. This is important. Contact a health care provider if: You notice large, tender lumps in your neck that were not there before. You have a fever that does not go away after 2-3 days. You develop a rash. You cough up a green, yellow-brown, or bloody substance. You cannot swallow liquids or food for 24 hours. Only one of your tonsils is swollen. Get help right away if: You develop any new symptoms, such as vomiting, severe headache, stiff neck, chest  pain, trouble breathing, or trouble swallowing. You have severe throat pain along with drooling or voice changes. You have severe pain that is not controlled with medicines. You cannot fully open your mouth. You develop redness, swelling, or severe pain anywhere in your neck. Summary Tonsillitis is an infection of the throat that causes the tonsils to become red, tender, and swollen. The most common symptom is pain in the throat. Tonsillitis is most often caused by a virus or bacteria. Get help right away if you develop any new symptoms, such as vomiting, severe headache, stiff neck, chest pain, or trouble breathing. This information is not intended to replace advice given to you by your health care provider. Make sure you discuss any questions you have with your health care provider. Document Revised: 01/18/2021 Document Reviewed: 01/18/2021 Elsevier Patient Education  [place].    If you have been instructed to have an in-person evaluation today at a local Urgent Care facility, please use the link below. It will take you to a list of all of our available [facility], including address, phone number and hours of operation. Please do not delay care.  [facility]  If  you or a family member do not have a primary care provider, use the link below to schedule a visit and establish care. When you choose a [facility] primary care physician or advanced practice provider, you gain a long-term partner in health. Find a Primary Care Provider  Learn more about [facility]'s in-office and virtual care options: [facility]

## 2022-11-07 ENCOUNTER — Ambulatory Visit (INDEPENDENT_AMBULATORY_CARE_PROVIDER_SITE_OTHER): Payer: BC Managed Care – PPO | Admitting: Family Medicine

## 2022-11-07 ENCOUNTER — Encounter: Payer: Self-pay | Admitting: Family Medicine

## 2022-11-07 VITALS — BP 130/86 | HR 80 | Temp 97.2°F | Ht 67.0 in | Wt 199.0 lb

## 2022-11-07 DIAGNOSIS — J019 Acute sinusitis, unspecified: Secondary | ICD-10-CM | POA: Insufficient documentation

## 2022-11-07 DIAGNOSIS — J018 Other acute sinusitis: Secondary | ICD-10-CM | POA: Diagnosis not present

## 2022-11-07 LAB — POC COVID19 BINAXNOW: SARS Coronavirus 2 Ag: NEGATIVE

## 2022-11-07 MED ORDER — AMOXICILLIN-POT CLAVULANATE 875-125 MG PO TABS: 1.0000 | ORAL_TABLET | Freq: Two times a day (BID) | ORAL | 0 refills | Status: DC

## 2022-11-07 NOTE — Progress Notes (Signed)
Subjective:  Patient ID: Nicole Chase, female    DOB: 10-11-1995  Age: 28 y.o. MRN: [id number]  Chief Complaint  Patient presents with   Cough   Ear Fullness   Nasal Congestion   Headache   Sore Throat    HPI  Upper respiratory symptomsno  fever, non productive cough, sore throat. Onset of symptoms was  6 days ago and staying constant.She is drinking plenty of fluids.  Past history is significant for occasional episodes of bronchitis. Patient is non-smoker. Completed a z-pak Covid 19 negative.  ON sudafed, sinus rinses, gargling salt water.       06/26/2022   10:28 AM 01/02/2022    2:22 PM 12/18/2021   10:23 AM 10/10/2021   10:55 AM 05/16/2021    2:41 PM  Depression screen PHQ 2/9  Decreased Interest 0 0 0 0 0  Down, Depressed, Hopeless 0 0 0 0 0  PHQ - 2 Score 0 0 0 0 0  Altered sleeping 1      Tired, decreased energy 3      Change in appetite 0      Feeling bad or failure about yourself  0      Trouble concentrating 0      Moving slowly or fidgety/restless 0      Suicidal thoughts 0      PHQ-9 Score 4      Difficult doing work/chores Somewhat difficult             10/10/2021   10:55 AM 10/15/2021    8:55 AM 12/18/2021   10:23 AM 01/02/2022    2:22 PM 11/07/2022    8:38 AM  Fall Risk  Falls in the past year? 0 0 0 0 0  Was there an injury with Fall? 0 0 0 0 0  Fall Risk Category Calculator 0 0 0 0 0  Fall Risk Category (Retired) Low Low Low Low   (RETIRED) Patient Fall Risk Level Low fall risk Low fall risk Low fall risk Low fall risk   Patient at Risk for Falls Due to  No Fall Risks   No Fall Risks  Fall risk Follow up  Falls evaluation completed   Falls evaluation completed      Review of Systems  Constitutional:  Positive for fatigue. Negative for chills and fever.  HENT:  Positive for congestion, ear pain and sore throat. Negative for rhinorrhea.   Respiratory:  Positive for cough. Negative for shortness of breath.   Gastrointestinal:  Negative for diarrhea,  nausea and vomiting.    Current Outpatient Medications on File Prior to Visit  Medication Sig Dispense Refill   amphetamine-dextroamphetamine (ADDERALL XR) 20 MG 24 hr capsule Take 1 capsule by mouth daily 30 capsule 0   amphetamine-dextroamphetamine (ADDERALL) 20 MG tablet Take 1 tablet by mouth daily 30 tablet 0   NUVARING 0.12-0.015 MG/24HR vaginal ring Place vaginally.     omeprazole (PRILOSEC) 40 MG capsule Take 1 capsule (40 mg total) by mouth daily. 30 capsule 3   ondansetron (ZOFRAN) 4 MG tablet Take 1 tablet (4 mg total) by mouth every 6 (six) hours as needed for nausea or vomiting. 20 tablet 1   traZODone (DESYREL) 50 MG tablet Take 0.5-1 tablets (25-50 mg total) by mouth at bedtime as needed for sleep. 90 tablet 1   No current facility-administered medications on file prior to visit.   Past Medical History:  Diagnosis Date   Exertional dyspnea 12/29/2019   Gestational hypertension  12/29/2019   Hearing loss    Palpitations 12/29/2019   Pancreatic cyst    Pre-eclampsia    Pregnancy induced hypertension    VHL (von Hippel-Lindau syndrome) (HCC)    Past Surgical History:  Procedure Laterality Date   WISDOM TOOTH EXTRACTION      Family History  Problem Relation Age of Onset   Healthy Mother    Von Hippel-Lindau syndrome Mother    Hearing loss Mother    Healthy Father    Von Hippel-Lindau syndrome Maternal Grandmother    Breast cancer Paternal Grandmother    Von Hippel-Lindau syndrome Maternal Uncle    Von Hippel-Lindau syndrome Maternal Aunt    Social History   Socioeconomic History   Marital status: Single    Spouse name: Not on file   Number of children: 1   Years of education: Not on file   Highest education level: Not on file  Occupational History   Occupation: RN  Tobacco Use   Smoking status: Never   Smokeless tobacco: Never  Vaping Use   Vaping Use: Never used  Substance and Sexual Activity   Alcohol use: Yes    Alcohol/week: 3.0 standard drinks of  alcohol    Types: 3 Glasses of wine per week    Comment: ocassionally   Drug use: No   Sexual activity: Yes    Partners: Male    Birth control/protection: None  Other Topics Concern   Not on file  Social History Narrative   Not on file   Social Determinants of Health   Financial Resource Strain: Not on file  Food Insecurity: Not on file  Transportation Needs: Not on file  Physical Activity: Not on file  Stress: Not on file  Social Connections: Not on file    Objective:  BP 130/86   Pulse 80   Temp (!) 97.2 F (36.2 C)   Ht '5\' 7"'$  (1.702 m)   Wt 199 lb (90.3 kg)   SpO2 99%   BMI 31.17 kg/m      11/07/2022    8:38 AM 06/26/2022   10:27 AM 04/23/2022    2:26 PM  BP/Weight  Systolic BP [id number]  Diastolic BP 86 76 74  Wt. (Lbs) 199 193.6 189.38  BMI 31.17 kg/m2 29.44 kg/m2 28.79 kg/m2    Physical Exam Vitals reviewed.  Constitutional:      Appearance: Normal appearance. She is well-developed.  HENT:     Right Ear: Ear canal and external ear normal. Tympanic membrane is retracted.     Left Ear: Ear canal and external ear normal. Tympanic membrane is retracted.     Nose: Congestion present.     Mouth/Throat:     Pharynx: Posterior oropharyngeal erythema present. No oropharyngeal exudate.     Comments: Enlarged tonsils Cardiovascular:     Rate and Rhythm: Normal rate and regular rhythm.     Heart sounds: Normal heart sounds. No murmur heard. Pulmonary:     Effort: Pulmonary effort is normal. No respiratory distress.     Breath sounds: Normal breath sounds.  Lymphadenopathy:     Cervical: No cervical adenopathy.  Neurological:     Mental Status: She is alert and oriented to person, place, and time.  Psychiatric:        Mood and Affect: Mood normal.        Behavior: Behavior normal.     Diabetic Foot Exam - Simple   No data filed      Lab Results  Component Value Date   WBC 10.4 06/26/2022   HGB 14.4 06/26/2022   HCT 42.7 06/26/2022   PLT 312  06/26/2022   GLUCOSE 126 (H) 06/26/2022   CHOL 152 11/08/2021   TRIG 89 11/08/2021   HDL 45 11/08/2021   LDLCALC 90 11/08/2021   ALT 76 (H) 06/26/2022   AST 47 (H) 06/26/2022   NA 136 06/26/2022   K 4.6 06/26/2022   CL 97 06/26/2022   CREATININE 0.84 06/26/2022   BUN 12 06/26/2022   CO2 25 06/26/2022   TSH 1.160 06/26/2022      Assessment & Plan:    Acute non-recurrent sinusitis of other sinus Assessment & Plan: Start augmentin 875 mg twice daily x 10 days Continue symptomatic treatment  Orders: -     POC COVID-19 BinaxNow  Other orders -     Amoxicillin-Pot Clavulanate; Take 1 tablet by mouth 2 (two) times daily.  Dispense: 20 tablet; Refill: 0     Meds ordered this encounter  Medications   amoxicillin-clavulanate (AUGMENTIN) 875-125 MG tablet    Sig: Take 1 tablet by mouth 2 (two) times daily.    Dispense:  20 tablet    Refill:  0    Orders Placed This Encounter  Procedures   POC COVID-19 BinaxNow     Follow-up: Return if symptoms worsen or fail to improve.    An After Visit Summary was printed and given to the patient.  Rochel Brome, MD [facility] [phone]

## 2022-11-07 NOTE — Assessment & Plan Note (Addendum)
Start augmentin 875 mg twice daily x 10 days Continue symptomatic treatment

## 2022-11-21 DIAGNOSIS — F900 Attention-deficit hyperactivity disorder, predominantly inattentive type: Secondary | ICD-10-CM | POA: Diagnosis not present

## 2022-11-21 DIAGNOSIS — F331 Major depressive disorder, recurrent, moderate: Secondary | ICD-10-CM | POA: Diagnosis not present

## 2022-12-26 ENCOUNTER — Ambulatory Visit: Payer: BC Managed Care – PPO | Admitting: Physician Assistant

## 2023-01-01 ENCOUNTER — Telehealth: Payer: Self-pay

## 2023-01-01 NOTE — Telephone Encounter (Signed)
Patient called requesting copy of her Immunization record. Printed it from [place] and then placed up front for patient to pick up.

## 2023-06-18 LAB — OB RESULTS CONSOLE HIV ANTIBODY (ROUTINE TESTING): HIV: NONREACTIVE

## 2023-06-18 LAB — OB RESULTS CONSOLE GC/CHLAMYDIA
Chlamydia: NEGATIVE
Neisseria Gonorrhea: NEGATIVE

## 2023-06-18 LAB — OB RESULTS CONSOLE RPR: RPR: NONREACTIVE

## 2023-06-18 LAB — OB RESULTS CONSOLE RUBELLA ANTIBODY, IGM: Rubella: IMMUNE

## 2023-06-18 LAB — OB RESULTS CONSOLE HEPATITIS B SURFACE ANTIGEN: Hepatitis B Surface Ag: NEGATIVE

## 2023-07-28 ENCOUNTER — Ambulatory Visit (INDEPENDENT_AMBULATORY_CARE_PROVIDER_SITE_OTHER): Payer: Commercial Managed Care - PPO | Admitting: Physician Assistant

## 2023-07-28 ENCOUNTER — Encounter: Payer: Self-pay | Admitting: Physician Assistant

## 2023-07-28 VITALS — BP 112/78 | HR 91 | Temp 97.3°F | Ht 67.0 in | Wt 209.0 lb

## 2023-07-28 DIAGNOSIS — J060 Acute laryngopharyngitis: Secondary | ICD-10-CM

## 2023-07-28 MED ORDER — AZITHROMYCIN 250 MG PO TABS: ORAL_TABLET | ORAL | 0 refills | Status: AC

## 2023-07-28 NOTE — Progress Notes (Signed)
   Acute Office Visit  Subjective:    Patient ID: Nicole Chase, female    DOB: 08/05/1996, 27 y.o.   MRN: [id number]  Chief Complaint  Patient presents with   COUGH/CONGESTION    HPI: Patient is in today for complaints of cough, cold, congestion for the past several days - she denies fever or wheezing She took a home COVID test which was negative Pt is [redacted] weeks gestation She is using robitussin and tylenol for symptoms   Current Outpatient Medications:    azithromycin (ZITHROMAX) 250 MG tablet, Take 2 tablets on day 1, then 1 tablet daily on days 2 through 5, Disp: 6 tablet, Rfl: 0   butalbital-acetaminophen-caffeine (FIORICET) 50-325-40 MG tablet, TAKE 1 TABLET EVERY 4 HOURS AS NEEDED, Disp: , Rfl:   Allergies  Allergen Reactions   Keflex [Cephalexin] Hives    ROS CONSTITUTIONAL: see HPI E/N/T: see HPI CARDIOVASCULAR: Negative for chest pain, RESPIRATORY: see HPI GASTROINTESTINAL: Negative for abdominal pain, acid reflux symptoms, constipation, diarrhea, nausea and vomiting.      Objective:    PHYSICAL EXAM:   BP 112/78 (BP Location: Left Arm, Patient Position: Sitting)   Pulse 91   Temp (!) 97.3 F (36.3 C) (Temporal)   Ht 5\' 7"  (1.702 m)   Wt 209 lb (94.8 kg)   SpO2 98%   BMI 32.73 kg/m    GEN: Well nourished, well developed, in no acute distress  HEENT: normal external ears and nose - normal external auditory canals and TMS -  - Lips, Teeth and Gums - normal  Oropharynx - erythema/pnd Cardiac: RRR; no murmurs,  Respiratory:  normal respiratory rate and pattern with no distress - normal breath sounds with no rales, rhonchi, wheezes or rubs      Assessment & Plan:    Acute laryngopharyngitis -     Azithromycin; Take 2 tablets on day 1, then 1 tablet daily on days 2 through 5  Dispense: 6 tablet; Refill: 0 Samples of zyrtec given to take qd Try delsym for cough    Follow-up: Return if symptoms worsen or fail to improve.  An After Visit Summary  was printed and given to the patient.  Jettie Pagan [facility] [phone]

## 2023-07-31 ENCOUNTER — Telehealth: Payer: Self-pay | Admitting: Physician Assistant

## 2023-10-29 ENCOUNTER — Encounter: Payer: Self-pay | Admitting: Dietician

## 2023-10-29 ENCOUNTER — Encounter: Payer: Commercial Managed Care - PPO | Attending: Obstetrics and Gynecology | Admitting: Dietician

## 2023-10-29 DIAGNOSIS — O99810 Abnormal glucose complicating pregnancy: Secondary | ICD-10-CM | POA: Insufficient documentation

## 2023-10-29 NOTE — Progress Notes (Signed)
 Patient was seen on 10/29/2023 for Gestational Diabetes self-management class at the [facility]. The following learning objectives were met by the patient during this course:  States the definition of Gestational Diabetes States why dietary management is important in controlling blood glucose Describes the effects each nutrient has on blood glucose levels Demonstrates ability to create a balanced meal plan Demonstrates carbohydrate counting  States when to check blood glucose levels Demonstrates proper blood glucose monitoring techniques States the effect of stress and exercise on blood glucose levels States the importance of limiting caffeine and abstaining from alcohol and smoking  Blood glucose monitor: Pt present with accu chek soft clix device and lancets, microlet lancets with lancing device (Pt encouraged to reach out to prescribing provider regarding rx for glucometer and strips) Blood glucose reading: 101, reported as pre meal per Pt   Patient instructed to monitor glucose levels: QID FBS: 60 - <90 1 hour: <140 2 hour: <120  *Patient received handouts: Nutrition Diabetes and Pregnancy Carbohydrate Counting List Blood glucose log Snack ideas for diabetes during pregnancy  Patient will be seen for follow-up as needed.

## 2023-12-09 LAB — OB RESULTS CONSOLE GBS: GBS: NEGATIVE

## 2023-12-25 ENCOUNTER — Other Ambulatory Visit: Payer: Self-pay | Admitting: Obstetrics and Gynecology

## 2023-12-29 ENCOUNTER — Encounter (HOSPITAL_COMMUNITY): Payer: Self-pay | Admitting: *Deleted

## 2023-12-30 ENCOUNTER — Inpatient Hospital Stay (HOSPITAL_COMMUNITY)

## 2023-12-30 ENCOUNTER — Inpatient Hospital Stay (HOSPITAL_COMMUNITY): Admitting: Anesthesiology

## 2023-12-30 ENCOUNTER — Encounter (HOSPITAL_COMMUNITY)
Admission: RE | Disposition: A | Discharge status: Home / Self Care | Payer: Self-pay | Source: Home / Self Care | Attending: Obstetrics and Gynecology

## 2023-12-30 ENCOUNTER — Inpatient Hospital Stay (HOSPITAL_COMMUNITY)
Admission: RE | Admit: 2023-12-30 | Discharge: 2024-01-03 | DRG: 788 | Disposition: A | Discharge status: Home / Self Care | Attending: Obstetrics and Gynecology | Admitting: Obstetrics and Gynecology

## 2023-12-30 ENCOUNTER — Encounter (HOSPITAL_COMMUNITY): Payer: Self-pay | Admitting: Obstetrics and Gynecology

## 2023-12-30 ENCOUNTER — Other Ambulatory Visit: Payer: Self-pay

## 2023-12-30 DIAGNOSIS — O24425 Gestational diabetes mellitus in childbirth, controlled by oral hypoglycemic drugs: Principal | ICD-10-CM | POA: Diagnosis present

## 2023-12-30 DIAGNOSIS — Z3A38 38 weeks gestation of pregnancy: Secondary | ICD-10-CM

## 2023-12-30 DIAGNOSIS — O135 Gestational [pregnancy-induced] hypertension without significant proteinuria, complicating the puerperium: Secondary | ICD-10-CM | POA: Diagnosis not present

## 2023-12-30 DIAGNOSIS — O9902 Anemia complicating childbirth: Secondary | ICD-10-CM | POA: Diagnosis present

## 2023-12-30 DIAGNOSIS — Z7984 Long term (current) use of oral hypoglycemic drugs: Secondary | ICD-10-CM

## 2023-12-30 DIAGNOSIS — O2412 Pre-existing diabetes mellitus, type 2, in childbirth: Secondary | ICD-10-CM | POA: Diagnosis present

## 2023-12-30 DIAGNOSIS — Z3490 Encounter for supervision of normal pregnancy, unspecified, unspecified trimester: Principal | ICD-10-CM | POA: Diagnosis present

## 2023-12-30 DIAGNOSIS — O3663X0 Maternal care for excessive fetal growth, third trimester, not applicable or unspecified: Secondary | ICD-10-CM | POA: Diagnosis present

## 2023-12-30 DIAGNOSIS — O339 Maternal care for disproportion, unspecified: Secondary | ICD-10-CM | POA: Diagnosis present

## 2023-12-30 DIAGNOSIS — E1165 Type 2 diabetes mellitus with hyperglycemia: Secondary | ICD-10-CM | POA: Diagnosis present

## 2023-12-30 DIAGNOSIS — O139 Gestational [pregnancy-induced] hypertension without significant proteinuria, unspecified trimester: Secondary | ICD-10-CM | POA: Diagnosis present

## 2023-12-30 DIAGNOSIS — O403XX0 Polyhydramnios, third trimester, not applicable or unspecified: Secondary | ICD-10-CM | POA: Diagnosis not present

## 2023-12-30 DIAGNOSIS — O24424 Gestational diabetes mellitus in childbirth, insulin controlled: Secondary | ICD-10-CM | POA: Diagnosis not present

## 2023-12-30 LAB — COMPREHENSIVE METABOLIC PANEL WITH GFR
ALT: 27 U/L (ref 0–44)
AST: 36 U/L (ref 15–41)
Albumin: 2.6 g/dL — ABNORMAL LOW (ref 3.5–5.0)
Alkaline Phosphatase: 90 U/L (ref 38–126)
Anion gap: 12 (ref 5–15)
BUN: 10 mg/dL (ref 6–20)
CO2: 18 mmol/L — ABNORMAL LOW (ref 22–32)
Calcium: 9.4 mg/dL (ref 8.9–10.3)
Chloride: 105 mmol/L (ref 98–111)
Creatinine, Ser: 0.75 mg/dL (ref 0.44–1.00)
GFR, Estimated: >60 mL/min (ref >60)
Glucose, Bld: 171 mg/dL — ABNORMAL HIGH (ref 70–99)
Potassium: 3.6 mmol/L (ref 3.5–5.1)
Sodium: 135 mmol/L (ref 135–145)
Total Bilirubin: 0.5 mg/dL (ref 0.0–1.2)
Total Protein: 6.4 g/dL — ABNORMAL LOW (ref 6.5–8.1)

## 2023-12-30 LAB — GLUCOSE, CAPILLARY
Glucose-Capillary: 75 mg/dL (ref 70–99)
Glucose-Capillary: 88 mg/dL (ref 70–99)

## 2023-12-30 LAB — CBC
HCT: 35.8 % — ABNORMAL LOW (ref 36.0–46.0)
Hemoglobin: 11.7 g/dL — ABNORMAL LOW (ref 12.0–15.0)
MCH: 29.2 pg (ref 26.0–34.0)
MCHC: 32.7 g/dL (ref 30.0–36.0)
MCV: 89.3 fL (ref 80.0–100.0)
Platelets: 212 10*3/uL (ref 150–400)
RBC: 4.01 MIL/uL (ref 3.87–5.11)
RDW: 13.9 % (ref 11.5–15.5)
WBC: 9.2 10*3/uL (ref 4.0–10.5)
nRBC: 0 % (ref 0.0–0.2)

## 2023-12-30 LAB — RPR: RPR Ser Ql: NONREACTIVE

## 2023-12-30 LAB — TYPE AND SCREEN
ABO/RH(D): A POS
Antibody Screen: NEGATIVE

## 2023-12-30 SURGERY — Surgical Case
Anesthesia: Epidural | Site: Abdomen

## 2023-12-30 MED ORDER — EPHEDRINE 5 MG/ML INJ: 10.0000 mg | INTRAVENOUS | Status: DC | PRN

## 2023-12-30 MED ORDER — FENTANYL-BUPIVACAINE-NACL 0.5-0.125-0.9 MG/250ML-% EP SOLN
12.0000 mL/h | EPIDURAL | Status: DC | PRN
  Administered 2023-12-30: 12 mL/h via EPIDURAL
  Filled 2023-12-30: qty 250

## 2023-12-30 MED ORDER — LIDOCAINE HCL (PF) 1 % IJ SOLN
INTRAMUSCULAR | Status: DC | PRN
  Administered 2023-12-30: 5 mL via EPIDURAL
  Administered 2023-12-30: 4 mL via EPIDURAL

## 2023-12-30 MED ORDER — TERBUTALINE SULFATE 1 MG/ML IJ SOLN: 0.2500 mg | Freq: Once | INTRAMUSCULAR | Status: DC | PRN

## 2023-12-30 MED ORDER — PHENYLEPHRINE 80 MCG/ML (10ML) SYRINGE FOR IV PUSH (FOR BLOOD PRESSURE SUPPORT): 80.0000 ug | PREFILLED_SYRINGE | INTRAVENOUS | Status: DC | PRN

## 2023-12-30 MED ORDER — ONDANSETRON HCL 4 MG/2ML IJ SOLN
4.0000 mg | Freq: Four times a day (QID) | INTRAMUSCULAR | Status: DC | PRN
  Administered 2023-12-30 (×2): 4 mg via INTRAVENOUS
  Filled 2023-12-30 (×2): qty 2

## 2023-12-30 MED ORDER — DIPHENHYDRAMINE HCL 50 MG/ML IJ SOLN: 12.5000 mg | INTRAMUSCULAR | Status: DC | PRN

## 2023-12-30 MED ORDER — FENTANYL CITRATE (PF) 100 MCG/2ML IJ SOLN
100.0000 ug | Freq: Once | INTRAMUSCULAR | Status: AC
  Administered 2023-12-30: 100 ug via INTRAVENOUS

## 2023-12-30 MED ORDER — LIDOCAINE HCL (PF) 1 % IJ SOLN: 30.0000 mL | INTRAMUSCULAR | Status: DC | PRN

## 2023-12-30 MED ORDER — MISOPROSTOL 25 MCG QUARTER TABLET: 25.0000 ug | ORAL_TABLET | ORAL | Status: DC | PRN

## 2023-12-30 MED ORDER — LACTATED RINGERS IV SOLN: 500.0000 mL | Freq: Once | INTRAVENOUS | Status: DC

## 2023-12-30 MED ORDER — FENTANYL CITRATE (PF) 100 MCG/2ML IJ SOLN
INTRAMUSCULAR | Status: AC
Start: 2023-12-30 — End: ?
  Filled 2023-12-30: qty 2

## 2023-12-30 MED ORDER — OXYTOCIN-SODIUM CHLORIDE 30-0.9 UT/500ML-% IV SOLN: 2.5000 [IU]/h | INTRAVENOUS | Status: DC

## 2023-12-30 MED ORDER — METFORMIN HCL 500 MG PO TABS
500.0000 mg | ORAL_TABLET | Freq: Once | ORAL | Status: AC
  Administered 2023-12-30: 500 mg via ORAL
  Filled 2023-12-30: qty 1

## 2023-12-30 MED ORDER — SOD CITRATE-CITRIC ACID 500-334 MG/5ML PO SOLN
30.0000 mL | ORAL | Status: DC | PRN
  Administered 2023-12-30: 30 mL via ORAL
  Filled 2023-12-30: qty 30

## 2023-12-30 MED ORDER — ACETAMINOPHEN 325 MG PO TABS: 650.0000 mg | ORAL_TABLET | ORAL | Status: DC | PRN

## 2023-12-30 MED ORDER — LIDOCAINE-EPINEPHRINE (PF) 2 %-1:200000 IJ SOLN
INTRAMUSCULAR | Status: DC | PRN
  Administered 2023-12-30 (×2): 5 mL via EPIDURAL
  Administered 2023-12-31: 2 mL via EPIDURAL

## 2023-12-30 MED ORDER — MORPHINE SULFATE (PF) 0.5 MG/ML IJ SOLN
INTRAMUSCULAR | Status: AC
  Filled 2023-12-30: qty 10

## 2023-12-30 MED ORDER — OXYTOCIN BOLUS FROM INFUSION
333.0000 mL | Freq: Once | INTRAVENOUS | Status: DC
Start: 2023-12-30 — End: 2023-12-31

## 2023-12-30 MED ORDER — OXYTOCIN-SODIUM CHLORIDE 30-0.9 UT/500ML-% IV SOLN
1.0000 m[IU]/min | INTRAVENOUS | Status: DC
  Administered 2023-12-30: 2 m[IU]/min via INTRAVENOUS
  Filled 2023-12-30: qty 500

## 2023-12-30 MED ORDER — LACTATED RINGERS IV SOLN: INTRAVENOUS | Status: DC

## 2023-12-30 MED ORDER — LACTATED RINGERS IV SOLN: 500.0000 mL | INTRAVENOUS | Status: DC | PRN

## 2023-12-30 MED ORDER — CLINDAMYCIN PHOSPHATE 900 MG/50ML IV SOLN
INTRAVENOUS | Status: AC
  Filled 2023-12-30: qty 50

## 2023-12-30 MED ORDER — MISOPROSTOL 50MCG HALF TABLET
50.0000 ug | ORAL_TABLET | ORAL | Status: DC | PRN
Start: 2023-12-30 — End: 2023-12-31
  Administered 2023-12-30: 50 ug via VAGINAL
  Filled 2023-12-30: qty 1

## 2023-12-30 MED ORDER — FENTANYL CITRATE (PF) 100 MCG/2ML IJ SOLN
INTRAMUSCULAR | Status: AC
  Filled 2023-12-30: qty 2

## 2023-12-30 SURGICAL SUPPLY — 30 items
BENZOIN TINCTURE PRP APPL 2/3 (GAUZE/BANDAGES/DRESSINGS) IMPLANT
CHLORAPREP W/TINT 26 (MISCELLANEOUS) ×2 IMPLANT
CLAMP UMBILICAL CORD (MISCELLANEOUS) ×1 IMPLANT
CLOTH BEACON ORANGE TIMEOUT ST (SAFETY) ×1 IMPLANT
DRSG OPSITE POSTOP 4X10 (GAUZE/BANDAGES/DRESSINGS) ×1 IMPLANT
ELECTRODE REM PT RTRN 9FT ADLT (ELECTROSURGICAL) ×1 IMPLANT
EXTRACTOR VACUUM KIWI (MISCELLANEOUS) IMPLANT
GLOVE BIOGEL PI IND STRL 7.0 (GLOVE) ×1 IMPLANT
GLOVE SURG LTX SZ8 (GLOVE) ×1 IMPLANT
GOWN STRL REUS W/TWL LRG LVL3 (GOWN DISPOSABLE) ×2 IMPLANT
KIT ABG SYR 3ML LUER SLIP (SYRINGE) IMPLANT
NDL HYPO 25X5/8 SAFETYGLIDE (NEEDLE) IMPLANT
NDL SPNL 20GX3.5 QUINCKE YW (NEEDLE) IMPLANT
NEEDLE HYPO 22GX1.5 SAFETY (NEEDLE) ×1 IMPLANT
NEEDLE HYPO 25X5/8 SAFETYGLIDE (NEEDLE) IMPLANT
NEEDLE SPNL 20GX3.5 QUINCKE YW (NEEDLE) IMPLANT
NS IRRIG 1000ML POUR BTL (IV SOLUTION) ×1 IMPLANT
PACK C SECTION WH (CUSTOM PROCEDURE TRAY) ×1 IMPLANT
STRIP CLOSURE SKIN 1/2X4 (GAUZE/BANDAGES/DRESSINGS) IMPLANT
SUT MNCRL 0 VIOLET CTX 36 (SUTURE) ×2 IMPLANT
SUT MNCRL AB 3-0 PS2 27 (SUTURE) IMPLANT
SUT MON AB 2-0 CT1 27 (SUTURE) ×1 IMPLANT
SUT MON AB-0 CT1 36 (SUTURE) ×2 IMPLANT
SUT PLAIN 0 NONE (SUTURE) IMPLANT
SUT PLAIN 2 0 XLH (SUTURE) IMPLANT
SUT PLAIN ABS 2-0 CT1 27XMFL (SUTURE) IMPLANT
SYR CONTROL 10ML LL (SYRINGE) ×1 IMPLANT
TOWEL OR 17X24 6PK STRL BLUE (TOWEL DISPOSABLE) ×1 IMPLANT
TRAY FOLEY W/BAG SLVR 14FR LF (SET/KITS/TRAYS/PACK) ×1 IMPLANT
WATER STERILE IRR 1000ML POUR (IV SOLUTION) ×1 IMPLANT

## 2023-12-30 NOTE — Progress Notes (Signed)
 Nicole Chase is a 28 y.o. G3P1011 at [redacted]w[redacted]d by LMP admitted for induction of labor due to Diabetes.  Subjective: Pushing well x one hr. Objective: BP (!) 148/80   Pulse 92   Temp 98.2 F (36.8 C) (Oral)   Resp 17   Ht 5\' 8"  (1.727 m)   Wt 112 kg   SpO2 98%   BMI 37.54 kg/m  I/O last 3 completed shifts: In: -  Out: 850 [Urine:850] No intake/output data recorded.  FHT:  160s with fair btbv, recurrent variable decels UC:   regular, every 2-3 minutes SVE:   FD/0/asynclitic IUPC with 230-250 MVU BS 88 Labs: Lab Results  Component Value Date   WBC 9.2 12/30/2023   HGB 11.7 (L) 12/30/2023   HCT 35.8 (L) 12/30/2023   MCV 89.3 12/30/2023   PLT 212 12/30/2023    Assessment / Plan: IOL for poorly controlled A2DM LGA History of SD Category 2 tracing remote from delivery No evidence of descent in second stage.  Labor: above Preeclampsia:  no signs or symptoms of toxicity and labs stable Fetal Wellbeing:  Category 2 Pain Control:  Epidural I/D:  n/a Anticipated MOD:  Cesarean section  No descent x one hr. New onset fetal tachy and recurrent variable decels- cat 2 remote from delivery/ LGA by sono. History of shoulder dystocia. Recommend csection. Pt agrees. Risks of anesthesia, infection, bleeding, injury to surrounding organs with need for repair discussed. Consent done. OR notified.   Camillo Celestine, MD 12/30/2023, 11:20 PM

## 2023-12-30 NOTE — Anesthesia Preprocedure Evaluation (Addendum)
 Anesthesia Evaluation  Patient identified by MRN, date of birth, ID band Patient awake    Reviewed: Allergy & Precautions, NPO status , Patient's Chart, lab work & pertinent test results  Airway Mallampati: II  TM Distance: >3 FB Neck ROM: Full    Dental no notable dental hx. 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, nothing in lumbar region   negative psych ROS   GI/Hepatic Neg liver ROS,,,  Endo/Other  diabetes, Poorly Controlled, Gestational, Oral Hypoglycemic Agents  Von Hippel Lindau syndrome Obesity Small pancreatic cysts in tail of pancreas earlier in pregnancy  Renal/GU  B/l renal masses on MRI      Musculoskeletal negative musculoskeletal ROS (+)    Abdominal  (+) + obese  Peds  Hematology  (+) Blood dyscrasia, anemia  Plt 212k    Anesthesia Other Findings   Reproductive/Obstetrics (+) Pregnancy                              Anesthesia Physical Anesthesia Plan  ASA: 3 and emergent  Anesthesia Plan: Epidural   Post-op Pain Management: Minimal or no pain anticipated   Induction: Intravenous  PONV Risk Score and Plan: 4 or greater and Treatment may vary due to age or medical condition  Airway Management Planned: Natural Airway  Additional Equipment: None and Fetal Monitoring  Intra-op Plan:   Post-operative Plan:   Informed Consent: I have reviewed the patients History and Physical, chart, labs and discussed the procedure including the risks, benefits and alternatives for the proposed anesthesia with the patient or authorized representative who has indicated his/her understanding and  acceptance.     Dental advisory given  Plan Discussed with: Anesthesiologist and CRNA  Anesthesia Plan Comments: (Labs reviewed. Platelets acceptable, patient not taking any blood thinning medications. Per RN, FHR tracing reported to be stable enough for sitting procedure. Risks and benefits discussed with patient, including PDPH, backache, epidural hematoma, failed epidural, blood pressure changes, allergic reaction, and nerve injury. Patient expressed understanding and wished to proceed.  Patient for C/Section for suspected fetal macrosomia, poorly controlled GDM and category 2 tracing with minimal progress of labor. Will use epidural for C/Section. M. Yvonnie Heritage, MD)         Anesthesia Quick Evaluation

## 2023-12-30 NOTE — Progress Notes (Signed)
 S: Feeling cramps. Husband, Austine Lefort, present and supportive. Discussed the R/B/A of AROM for labor induction and patient consents to procedure. Eagerly anticipating a baby boy "Nicole Chase".   O: Vitals:   12/30/23 0651 12/30/23 0734 12/30/23 0911 12/30/23 1030  BP: (!) 142/74 139/76 (!) 146/70   Pulse: 98 94 88   Resp: 16   16  Temp: 99.4 F (37.4 C)     TempSrc: Oral     Weight:      Height:       FHT:  FHR: 140 bpm, variability: moderate,  accelerations:  Present,  decelerations:  Absent UC:   regular, every 3-5 minutes SVE:   Dilation: 3 Effacement (%): 50 Station: -3 Exam by:: Olie Scaffidi, CNM  AROM of a large amount of clear fluid at 1144.   A / P: Induction of labor due to gestational diabetes, s/p 1 dose of vaginal Cytotec , now AROM for clear fluid  Fetal Wellbeing:  Category I PEC: Mild range BP, denies PEC symptoms, labs WNL GBS: Negative Pain Control:  IV pain meds Anticipated MOD:  NSVD  Will start Pitocin  2x2. Patient may have epidural upon request.   Dr. Harless Lien updated on patient status and plan of care.   Joel Murphy, CNM, MSN 12/30/2023, 11:47 AM

## 2023-12-30 NOTE — Progress Notes (Signed)
 Nicole Chase is a 28 y.o. G3P1011 at [redacted]w[redacted]d by LMP admitted for induction of labor due to Diabetes.  Subjective: Comfortable with epidural. Occ pressure  Objective: BP (!) 109/44   Pulse 62   Temp 99.2 F (37.3 C) (Oral)   Resp 17   Ht 5\' 8"  (1.727 m)   Wt 112 kg   SpO2 99%   BMI 37.54 kg/m  I/O last 3 completed shifts: In: -  Out: 850 [Urine:850] No intake/output data recorded.  FHT:  FHR: 150s bpm, variability: moderate,  accelerations:  Present,  decelerations:  Absent UC:   regular, every 2-3 minutes SVE:   8/70/-1/asynclitic IUPC with 230-250 MVU BS 88 Labs: Lab Results  Component Value Date   WBC 9.2 12/30/2023   HGB 11.7 (L) 12/30/2023   HCT 35.8 (L) 12/30/2023   MCV 89.3 12/30/2023   PLT 212 12/30/2023    Assessment / Plan: Induction of labor due to gestational diabetes,  progressing well on pitocin  LGA History of SD  Labor: Progressing normally Preeclampsia:  no signs or symptoms of toxicity and labs stable Fetal Wellbeing:  Category I Pain Control:  Epidural I/D:  n/a Anticipated MOD:  NSVD  Camillo Celestine, MD 12/30/2023, 9:02 PM

## 2023-12-30 NOTE — Plan of Care (Signed)

## 2023-12-30 NOTE — Anesthesia Procedure Notes (Signed)
 Epidural Patient location during procedure: OB Start time: 12/30/2023 12:52 PM End time: 12/30/2023 12:55 PM  Staffing Anesthesiologist: Juventino Oppenheim, MD Performed: anesthesiologist   Preanesthetic Checklist Completed: patient identified, IV checked, risks and benefits discussed, monitors and equipment checked, pre-op evaluation and timeout performed  Epidural Patient position: sitting Prep: DuraPrep Patient monitoring: continuous pulse ox and blood pressure Approach: midline Location: L3-L4 Injection technique: LOR saline  Needle:  Needle type: Tuohy  Needle gauge: 17 G Needle length: 9 cm Needle insertion depth: 7 cm Catheter size: 19 Gauge Catheter at skin depth: 11 cm Test dose: negative and Other (1% lidocaine )  Assessment Events: blood not aspirated and no cerebrospinal fluid  Additional Notes Patient identified. Risks including, but not limited to, bleeding, infection, nerve damage, paralysis, inadequate analgesia, blood pressure changes, nausea, vomiting, allergic reaction, postpartum back pain, itching, and headache were discussed. Patient expressed understanding and wished to proceed. Sterile prep and drape, including hand hygiene, mask, and sterile gloves were used. The patient was positioned and the spine was prepped. The skin was anesthetized with lidocaine . No paraesthesia or other complication noted. The patient did not experience any signs of intravascular injection such as tinnitus or metallic taste in mouth, nor signs of intrathecal spread such as rapid motor block. Please see nursing notes for vital signs. The patient tolerated the procedure well.   Hobart Lulas, MDReason for block:procedure for pain

## 2023-12-30 NOTE — Op Note (Signed)
 Cesarean Section Procedure Note  Indications: cephalo-pelvic disproportion, failure to progress: arrest of descent, macrosomia, and non-reassuring fetal status. History of shoulder dystocia.  Pre-operative Diagnosis: 38 week 1 day pregnancy.  Post-operative Diagnosis: same  Surgeon: Camillo Celestine   Assistants: Scherrie Curt, MD  Anesthesia: Epidural anesthesia and Local anesthesia 0.25.% bupivacaine   ASA Class: 2  Procedure Details  The patient was seen in the Holding Room. The risks, benefits, complications, treatment options, and expected outcomes were discussed with the patient.  The patient concurred with the proposed plan, giving informed consent. The risks of anesthesia, infection, bleeding and possible injury to other organs discussed. Injury to bowel, bladder, or ureter with possible need for repair discussed. Possible need for transfusion with secondary risks of hepatitis or HIV acquisition discussed. Post operative complications to include but not limited to DVT, PE and Pneumonia noted. The site of surgery properly noted/marked. The patient was taken to Operating Room # C, identified as Nicole Chase and the procedure verified as C-Section Delivery. A Time Out was held and the above information confirmed.  After induction of anesthesia, the patient was draped and prepped in the usual sterile manner. A Pfannenstiel incision was made and carried down through the subcutaneous tissue to the fascia. Fascial incision was made and extended transversely using Mayo scissors. The fascia was separated from the underlying rectus tissue superiorly and inferiorly. The peritoneum was identified and entered. Peritoneal incision was extended longitudinally. The utero-vesical peritoneal reflection was incised transversely and the bladder flap was bluntly freed from the lower uterine segment. A low transverse uterine incision(Kerr hysterotomy) was made. Delivered from female presentation was a  OT with Apgar  scores of 8 at one minute and 9 at five minutes. Bulb suctioning gently performed. Neonatal team in attendance.After the umbilical cord was clamped and cut cord blood was obtained for evaluation. The placenta was removed intact and appeared normal. The uterus was curetted with a dry lap pack. Good hemostasis was noted.The uterine outline, tubes and ovaries appeared normal. The uterine incision was closed with running locked sutures of 0 Monocryl x 1 layers. Hemostasis was observed. Lavage was carried out until clear.Rectus muscles loosely reapproximated. The fascia was then reapproximated with running sutures of 0 Monocryl. The skin was reapproximated with 3-0 monocryl after [place] closure with 2-0 plain.   Instrument, sponge, and needle counts were correct prior the abdominal closure and at the conclusion of the case.   Findings: FTLM  Estimated Blood Loss:  300 mL         Drains: foley                 Specimens: placenta                 Complications:  None; patient tolerated the procedure well.         Disposition: PACU - hemodynamically stable.         Condition: stable  Attending Attestation: I performed the procedure.

## 2023-12-30 NOTE — Progress Notes (Signed)
 IMANE BURROUGH is a 28 y.o. G3P1011 at [redacted]w[redacted]d by LMP admitted for induction of labor due to Diabetes.  Subjective: Comfortable with epidural  Objective: BP 137/62   Pulse 83   Temp 99.2 F (37.3 C) (Oral)   Resp 16   Ht 5\' 8"  (1.727 m)   Wt 112 kg   SpO2 99%   BMI 37.54 kg/m  No intake/output data recorded. No intake/output data recorded.  FHT:  FHR: 150s bpm, variability: moderate,  accelerations:  Present,  decelerations:  Absent UC:   regular, every 2-3 minutes SVE:   4/50/-2 IUPC placed without difficulty BS 75 Labs: Lab Results  Component Value Date   WBC 9.2 12/30/2023   HGB 11.7 (L) 12/30/2023   HCT 35.8 (L) 12/30/2023   MCV 89.3 12/30/2023   PLT 212 12/30/2023    Assessment / Plan: Induction of labor due to gestational diabetes,  progressing well on pitocin  LGA History of SD  Labor: Progressing normally Preeclampsia:  no signs or symptoms of toxicity and labs stable Fetal Wellbeing:  Category I Pain Control:  Epidural I/D:  n/a Anticipated MOD:  NSVD  Camillo Celestine, MD 12/30/2023, 1:34 PM

## 2023-12-30 NOTE — H&P (Addendum)
 RAFEEF Chase is a 28 y.o. 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 Known VHL with renal involvement. Nl CMP. Seen at [facility]. MRI with stable spinal lesions. Reviewed by DrWoodrum. Ok for regional anesthesia OB History     Gravida  3   Para  1   Term  1   Preterm      AB  1   Living  1      SAB  1   IAB      Ectopic      Multiple  0   Live Births  1          Past Medical History:  Diagnosis Date   Exertional dyspnea 12/29/2019   Gestational hypertension 12/29/2019   Hearing loss    Palpitations 12/29/2019   Pancreatic cyst    Pre-eclampsia    Pregnancy induced hypertension    VHL (von Hippel-Lindau syndrome) (HCC)    Past Surgical History:  Procedure Laterality Date   WISDOM TOOTH EXTRACTION     Family History: family history includes Breast cancer in her paternal grandmother; Healthy in her father and mother; Hearing loss in her mother; Von Hippel-Lindau syndrome in her maternal aunt, maternal grandmother, maternal uncle, and mother. Social History:  reports that she has never smoked. She has never used smokeless tobacco. She reports current alcohol use of about 3.0 standard drinks of alcohol per week. She reports that she does not use drugs.     Maternal Diabetes: Yes:  Diabetes Type:  Insulin/Medication controlled- poorly controlled on metformin  Genetic Screening: Normal Maternal Ultrasounds/Referrals: Normal Fetal Ultrasounds or other Referrals:  LGA with mild poly Maternal Substance Abuse:  No Significant Maternal Medications:  metformin  Significant Maternal Lab Results:  Group B Strep negative Number of Prenatal Visits:greater than 3 verified prenatal visits Maternal Vaccinations:TDap Other Comments:  Pt with known Von Hippel Landau  Review of Systems  Constitutional: Negative.   All other systems reviewed and are negative.  Maternal Medical History:  Reason for admission: Contractions.   Contractions:  Frequency: rare.   Perceived severity is mild.   Fetal activity: Perceived fetal activity is normal.   Prenatal complications: Polyhydramnios.   Prenatal Complications - Diabetes: gestational. Diabetes is managed by diet and oral agent (monotherapy).       Height 5\' 8"  (1.727 m), weight 112 kg, unknown if currently breastfeeding. Maternal Exam:  Uterine Assessment: Contraction strength is mild.  Contraction frequency is rare.  Abdomen: Patient reports no abdominal tenderness. Fetal presentation: vertex Introitus: Normal vulva. Normal vagina.  Ferning test: not done.  Nitrazine test: not done. Pelvis: questionable for delivery.   Cervix: Cervix evaluated by digital exam.     Physical Exam Vitals and nursing note reviewed.  Constitutional:      Appearance: Normal appearance. She is normal weight.  HENT:     Head: Normocephalic and atraumatic.  Cardiovascular:     Rate and Rhythm: Normal rate and regular rhythm.     Pulses: Normal pulses.     Heart sounds: Normal heart sounds.  Pulmonary:     Effort: Pulmonary effort is normal.     Breath sounds: Normal breath sounds.  Abdominal:     General: Bowel sounds are normal.     Palpations: Abdomen is soft.  Genitourinary:    General: Normal vulva.  Musculoskeletal:        General: Normal range of motion.     Cervical back: Normal range of motion.  Skin:    General: Skin is warm and dry.  Neurological:     General: No focal deficit present.     Mental Status: She is alert and oriented to person, place, and time.  Psychiatric:        Mood and Affect: Mood normal.        Behavior: Behavior normal.     Prenatal labs: ABO, Rh:   Antibody:   Rubella: Immune (10/09 0000) RPR: Nonreactive (10/09 0000)  HBsAg: Negative (10/09 0000)  HIV: Non-reactive (10/09 0000)  GBS: Negative/-- (04/01 0000)   Assessment/Plan: A2DM- poorly controlled on metformin . LGA with Poly VHL - followed at [facility] History of shoulder dystocia- declines  csection.  IOL BS q6 CMP with CBC.   Nicole Chase J 12/30/2023, 6:55 AM

## 2023-12-31 ENCOUNTER — Inpatient Hospital Stay (HOSPITAL_COMMUNITY): Admitting: Anesthesiology

## 2023-12-31 ENCOUNTER — Other Ambulatory Visit: Payer: Self-pay

## 2023-12-31 ENCOUNTER — Encounter (HOSPITAL_COMMUNITY): Payer: Self-pay | Admitting: Obstetrics and Gynecology

## 2023-12-31 DIAGNOSIS — Z3A38 38 weeks gestation of pregnancy: Secondary | ICD-10-CM | POA: Diagnosis not present

## 2023-12-31 LAB — GLUCOSE, CAPILLARY
Glucose-Capillary: 124 mg/dL — ABNORMAL HIGH (ref 70–99)
Glucose-Capillary: 120 mg/dL — ABNORMAL HIGH (ref 70–99)

## 2023-12-31 LAB — CBC
HCT: 34.1 % — ABNORMAL LOW (ref 36.0–46.0)
Hemoglobin: 11.1 g/dL — ABNORMAL LOW (ref 12.0–15.0)
MCH: 29.1 pg (ref 26.0–34.0)
MCHC: 32.6 g/dL (ref 30.0–36.0)
MCV: 89.3 fL (ref 80.0–100.0)
Platelets: 189 10*3/uL (ref 150–400)
RBC: 3.82 MIL/uL — ABNORMAL LOW (ref 3.87–5.11)
RDW: 14 % (ref 11.5–15.5)
WBC: 16 10*3/uL — ABNORMAL HIGH (ref 4.0–10.5)
nRBC: 0 % (ref 0.0–0.2)

## 2023-12-31 MED ORDER — TRANEXAMIC ACID-NACL 1000-0.7 MG/100ML-% IV SOLN
INTRAVENOUS | Status: DC | PRN
Start: 2023-12-31 — End: 2023-12-31
  Administered 2023-12-31: 1000 mg via INTRAVENOUS

## 2023-12-31 MED ORDER — IBUPROFEN 600 MG PO TABS
600.0000 mg | ORAL_TABLET | Freq: Four times a day (QID) | ORAL | Status: DC
  Administered 2024-01-01 – 2024-01-03 (×10): 600 mg via ORAL
  Filled 2023-12-31 (×10): qty 1

## 2023-12-31 MED ORDER — ZOLPIDEM TARTRATE 5 MG PO TABS: 5.0000 mg | ORAL_TABLET | Freq: Every evening | ORAL | Status: DC | PRN

## 2023-12-31 MED ORDER — CLINDAMYCIN PHOSPHATE 900 MG/50ML IV SOLN
INTRAVENOUS | Status: DC | PRN
  Administered 2023-12-31: 900 mg via INTRAVENOUS

## 2023-12-31 MED ORDER — DIPHENHYDRAMINE HCL 25 MG PO CAPS
25.0000 mg | ORAL_CAPSULE | ORAL | Status: DC | PRN
  Administered 2024-01-01: 25 mg via ORAL
  Filled 2023-12-31 (×2): qty 1

## 2023-12-31 MED ORDER — ACETAMINOPHEN 10 MG/ML IV SOLN
INTRAVENOUS | Status: DC | PRN
  Administered 2023-12-31: 1000 mg via INTRAVENOUS

## 2023-12-31 MED ORDER — FENTANYL CITRATE (PF) 100 MCG/2ML IJ SOLN
INTRAMUSCULAR | Status: AC
Start: 2023-12-31 — End: ?
  Filled 2023-12-31: qty 2

## 2023-12-31 MED ORDER — FENTANYL CITRATE (PF) 100 MCG/2ML IJ SOLN
INTRAMUSCULAR | Status: DC | PRN
  Administered 2023-12-31: 100 ug via EPIDURAL

## 2023-12-31 MED ORDER — DEXMEDETOMIDINE HCL IN NACL 80 MCG/20ML IV SOLN
INTRAVENOUS | Status: DC | PRN
  Administered 2023-12-31: 8 ug via INTRAVENOUS

## 2023-12-31 MED ORDER — BUPIVACAINE HCL (PF) 0.25 % IJ SOLN
INTRAMUSCULAR | Status: AC
  Filled 2023-12-31: qty 10

## 2023-12-31 MED ORDER — METHYLERGONOVINE MALEATE 0.2 MG/ML IJ SOLN: 0.2000 mg | INTRAMUSCULAR | Status: DC | PRN

## 2023-12-31 MED ORDER — SODIUM CHLORIDE 0.9 % IV SOLN
INTRAVENOUS | Status: DC | PRN
  Administered 2023-12-31: 500 mg via INTRAVENOUS

## 2023-12-31 MED ORDER — DEXAMETHASONE SODIUM PHOSPHATE 10 MG/ML IJ SOLN
INTRAMUSCULAR | Status: DC | PRN
Start: 2023-12-31 — End: 2023-12-31
  Administered 2023-12-31: 5 mg via INTRAVENOUS

## 2023-12-31 MED ORDER — PHENYLEPHRINE HCL-NACL 20-0.9 MG/250ML-% IV SOLN
INTRAVENOUS | Status: DC | PRN
  Administered 2023-12-30: 30 ug/min via INTRAVENOUS

## 2023-12-31 MED ORDER — STERILE WATER FOR IRRIGATION IR SOLN
Status: DC | PRN
  Administered 2023-12-31: 1000 mL

## 2023-12-31 MED ORDER — SCOPOLAMINE 1 MG/3DAYS TD PT72
1.0000 | MEDICATED_PATCH | TRANSDERMAL | Status: DC
  Administered 2023-12-31 – 2024-01-03 (×2): 1.5 mg via TRANSDERMAL
  Filled 2023-12-31 (×2): qty 1

## 2023-12-31 MED ORDER — SIMETHICONE 80 MG PO CHEW
80.0000 mg | CHEWABLE_TABLET | Freq: Three times a day (TID) | ORAL | Status: DC
  Administered 2023-12-31 – 2024-01-03 (×11): 80 mg via ORAL
  Filled 2023-12-31 (×11): qty 1

## 2023-12-31 MED ORDER — SODIUM CHLORIDE 0.9 % IV SOLN
25.0000 mg | Freq: Four times a day (QID) | INTRAVENOUS | Status: DC | PRN
  Administered 2023-12-31: 25 mg via INTRAVENOUS
  Filled 2023-12-31: qty 1

## 2023-12-31 MED ORDER — MENTHOL 3 MG MT LOZG: 1.0000 | LOZENGE | OROMUCOSAL | Status: DC | PRN

## 2023-12-31 MED ORDER — FENTANYL CITRATE (PF) 100 MCG/2ML IJ SOLN: 25.0000 ug | INTRAMUSCULAR | Status: DC | PRN

## 2023-12-31 MED ORDER — ONDANSETRON HCL 4 MG/2ML IJ SOLN
4.0000 mg | Freq: Three times a day (TID) | INTRAMUSCULAR | Status: DC | PRN
  Filled 2023-12-31: qty 2

## 2023-12-31 MED ORDER — MEPERIDINE HCL 25 MG/ML IJ SOLN: 6.2500 mg | INTRAMUSCULAR | Status: DC | PRN

## 2023-12-31 MED ORDER — KETOROLAC TROMETHAMINE 30 MG/ML IJ SOLN
30.0000 mg | Freq: Four times a day (QID) | INTRAMUSCULAR | Status: AC
  Administered 2023-12-31 (×4): 30 mg via INTRAVENOUS
  Filled 2023-12-31 (×4): qty 1

## 2023-12-31 MED ORDER — KETOROLAC TROMETHAMINE 30 MG/ML IJ SOLN: 30.0000 mg | Freq: Four times a day (QID) | INTRAMUSCULAR | Status: DC | PRN

## 2023-12-31 MED ORDER — SENNOSIDES-DOCUSATE SODIUM 8.6-50 MG PO TABS
2.0000 | ORAL_TABLET | Freq: Every day | ORAL | Status: DC
  Administered 2024-01-01 – 2024-01-03 (×3): 2 via ORAL
  Filled 2023-12-31 (×3): qty 2

## 2023-12-31 MED ORDER — DIBUCAINE (PERIANAL) 1 % EX OINT: 1.0000 | TOPICAL_OINTMENT | CUTANEOUS | Status: DC | PRN

## 2023-12-31 MED ORDER — SIMETHICONE 80 MG PO CHEW
80.0000 mg | CHEWABLE_TABLET | ORAL | Status: DC | PRN
  Administered 2023-12-31: 80 mg via ORAL
  Filled 2023-12-31: qty 1

## 2023-12-31 MED ORDER — WITCH HAZEL-GLYCERIN EX PADS: 1.0000 | MEDICATED_PAD | CUTANEOUS | Status: DC | PRN

## 2023-12-31 MED ORDER — OXYCODONE HCL 5 MG PO TABS
5.0000 mg | ORAL_TABLET | ORAL | Status: DC | PRN
  Administered 2024-01-01 – 2024-01-03 (×11): 10 mg via ORAL
  Filled 2023-12-31 (×11): qty 2

## 2023-12-31 MED ORDER — SODIUM CHLORIDE 0.9 % IV SOLN: INTRAVENOUS | Status: DC | PRN

## 2023-12-31 MED ORDER — ACETAMINOPHEN 500 MG PO TABS
1000.0000 mg | ORAL_TABLET | Freq: Four times a day (QID) | ORAL | Status: DC
  Administered 2023-12-31 – 2024-01-03 (×14): 1000 mg via ORAL
  Filled 2023-12-31 (×14): qty 2

## 2023-12-31 MED ORDER — METHYLERGONOVINE MALEATE 0.2 MG PO TABS: 0.2000 mg | ORAL_TABLET | ORAL | Status: DC | PRN

## 2023-12-31 MED ORDER — SODIUM CHLORIDE 0.9% FLUSH: 3.0000 mL | INTRAVENOUS | Status: DC | PRN

## 2023-12-31 MED ORDER — DIPHENHYDRAMINE HCL 25 MG PO CAPS
25.0000 mg | ORAL_CAPSULE | Freq: Four times a day (QID) | ORAL | Status: DC | PRN
  Administered 2023-12-31 (×2): 25 mg via ORAL
  Filled 2023-12-31: qty 1

## 2023-12-31 MED ORDER — OXYTOCIN-SODIUM CHLORIDE 30-0.9 UT/500ML-% IV SOLN
INTRAVENOUS | Status: DC | PRN
  Administered 2023-12-31: 30 [IU] via INTRAVENOUS

## 2023-12-31 MED ORDER — NALOXONE HCL 4 MG/10ML IJ SOLN: 1.0000 ug/kg/h | INTRAVENOUS | Status: DC | PRN

## 2023-12-31 MED ORDER — PRENATAL MULTIVITAMIN CH
1.0000 | ORAL_TABLET | Freq: Every day | ORAL | Status: DC
  Administered 2023-12-31 – 2024-01-03 (×4): 1 via ORAL
  Filled 2023-12-31 (×4): qty 1

## 2023-12-31 MED ORDER — BUPIVACAINE HCL 0.25 % IJ SOLN
INTRAMUSCULAR | Status: DC | PRN
  Administered 2023-12-31: 30 mL

## 2023-12-31 MED ORDER — NALOXONE HCL 0.4 MG/ML IJ SOLN: 0.4000 mg | INTRAMUSCULAR | Status: DC | PRN

## 2023-12-31 MED ORDER — MORPHINE SULFATE (PF) 0.5 MG/ML IJ SOLN
INTRAMUSCULAR | Status: DC | PRN
  Administered 2023-12-31: 3 mg via EPIDURAL

## 2023-12-31 MED ORDER — LACTATED RINGERS IV SOLN: INTRAVENOUS | Status: DC | PRN

## 2023-12-31 MED ORDER — COCONUT OIL OIL
1.0000 | TOPICAL_OIL | Status: DC | PRN
  Administered 2024-01-03: 1 via TOPICAL

## 2023-12-31 MED ORDER — BUPIVACAINE HCL (PF) 0.25 % IJ SOLN
INTRAMUSCULAR | Status: AC
  Filled 2023-12-31: qty 20

## 2023-12-31 MED ORDER — DIPHENHYDRAMINE HCL 50 MG/ML IJ SOLN
12.5000 mg | INTRAMUSCULAR | Status: DC | PRN
  Administered 2023-12-31 (×2): 12.5 mg via INTRAVENOUS
  Filled 2023-12-31 (×2): qty 1

## 2023-12-31 MED ORDER — SODIUM CHLORIDE 0.9 % IR SOLN
Status: DC | PRN
  Administered 2023-12-31: 1

## 2023-12-31 MED ORDER — OXYTOCIN-SODIUM CHLORIDE 30-0.9 UT/500ML-% IV SOLN
2.5000 [IU]/h | INTRAVENOUS | Status: AC
  Administered 2023-12-31: 2.5 [IU]/h via INTRAVENOUS
  Filled 2023-12-31: qty 500

## 2023-12-31 NOTE — Plan of Care (Signed)
 Problem: Education: Goal: Knowledge of General Education information will improve Description: Including pain rating scale, medication(s)/side effects and non-pharmacologic comfort measures 12/31/2023 0033 by Larayne Platter, RN Outcome: Completed/Met 12/30/2023 2123 by Larayne Platter, RN Outcome: Progressing   Problem: Health Behavior/Discharge Planning: Goal: Ability to manage health-related needs will improve 12/31/2023 0033 by Larayne Platter, RN Outcome: Completed/Met 12/30/2023 2123 by Larayne Platter, RN Outcome: Progressing   Problem: Clinical Measurements: Goal: Ability to maintain clinical measurements within normal limits will improve 12/31/2023 0033 by Larayne Platter, RN Outcome: Completed/Met 12/30/2023 2123 by Larayne Platter, RN Outcome: Progressing Goal: Will remain free from infection 12/31/2023 0033 by Larayne Platter, RN Outcome: Completed/Met 12/30/2023 2123 by Larayne Platter, RN Outcome: Progressing Goal: Diagnostic test results will improve 12/31/2023 0033 by Larayne Platter, RN Outcome: Completed/Met 12/30/2023 2123 by Larayne Platter, RN Outcome: Progressing Goal: Respiratory complications will improve 12/31/2023 0033 by Larayne Platter, RN Outcome: Completed/Met 12/30/2023 2123 by Larayne Platter, RN Outcome: Progressing Goal: Cardiovascular complication will be avoided 12/31/2023 0033 by Clarissa Laird D, RN Outcome: Completed/Met 12/30/2023 2123 by Larayne Platter, RN Outcome: Progressing   Problem: Activity: Goal: Risk for activity intolerance will decrease 12/31/2023 0033 by Larayne Platter, RN Outcome: Completed/Met 12/30/2023 2123 by Larayne Platter, RN Outcome: Progressing   Problem: Nutrition: Goal: Adequate nutrition will be maintained 12/31/2023 0033 by Larayne Platter, RN Outcome: Completed/Met 12/30/2023 2123 by Larayne Platter, RN Outcome: Progressing   Problem:  Coping: Goal: Level of anxiety will decrease 12/31/2023 0033 by Larayne Platter, RN Outcome: Completed/Met 12/30/2023 2123 by Larayne Platter, RN Outcome: Progressing   Problem: Elimination: Goal: Will not experience complications related to bowel motility 12/31/2023 0033 by Larayne Platter, RN Outcome: Completed/Met 12/30/2023 2123 by Larayne Platter, RN Outcome: Progressing Goal: Will not experience complications related to urinary retention 12/31/2023 0033 by Larayne Platter, RN Outcome: Completed/Met 12/30/2023 2123 by Larayne Platter, RN Outcome: Progressing   Problem: Pain Managment: Goal: General experience of comfort will improve and/or be controlled 12/31/2023 0033 by Larayne Platter, RN Outcome: Completed/Met 12/30/2023 2123 by Larayne Platter, RN Outcome: Progressing   Problem: Safety: Goal: Ability to remain free from injury will improve 12/31/2023 0033 by Larayne Platter, RN Outcome: Completed/Met 12/30/2023 2123 by Larayne Platter, RN Outcome: Progressing   Problem: Skin Integrity: Goal: Risk for impaired skin integrity will decrease 12/31/2023 0033 by Larayne Platter, RN Outcome: Completed/Met 12/30/2023 2123 by Larayne Platter, RN Outcome: Progressing   Problem: Education: Goal: Knowledge of Childbirth will improve 12/31/2023 0033 by Larayne Platter, RN Outcome: Completed/Met 12/30/2023 2123 by Larayne Platter, RN Outcome: Progressing Goal: Ability to make informed decisions regarding treatment and plan of care will improve 12/31/2023 0033 by Larayne Platter, RN Outcome: Completed/Met 12/30/2023 2123 by Larayne Platter, RN Outcome: Progressing Goal: Ability to state and carry out methods to decrease the pain will improve 12/31/2023 0033 by Teasia Zapf D, RN Outcome: Completed/Met 12/30/2023 2123 by Larayne Platter, RN Outcome: Progressing Goal: Individualized Educational Video(s) 12/31/2023  0033 by Larayne Platter, RN Outcome: Completed/Met 12/30/2023 2123 by Larayne Platter, RN Outcome: Progressing   Problem: Coping: Goal: Ability to verbalize concerns and feelings about labor and delivery will improve 12/31/2023 0033 by Katelynd Blauvelt D, RN Outcome: Completed/Met 12/30/2023 2123 by Larayne Platter, RN Outcome: Progressing   Problem: Life Cycle: Goal: Ability to make normal progression  through stages of labor will improve 12/31/2023 0033 by Belisa Eichholz D, RN Outcome: Completed/Met 12/30/2023 2123 by Larayne Platter, RN Outcome: Progressing Goal: Ability to effectively push during vaginal delivery will improve 12/31/2023 0033 by Caedyn Raygoza D, RN Outcome: Completed/Met 12/30/2023 2123 by Larayne Platter, RN Outcome: Progressing   Problem: Role Relationship: Goal: Will demonstrate positive interactions with the child 12/31/2023 0033 by Larayne Platter, RN Outcome: Completed/Met 12/30/2023 2123 by Larayne Platter, RN Outcome: Progressing   Problem: Safety: Goal: Risk of complications during labor and delivery will decrease 12/31/2023 0033 by Larayne Platter, RN Outcome: Completed/Met 12/30/2023 2123 by Larayne Platter, RN Outcome: Progressing   Problem: Pain Management: Goal: Relief or control of pain from uterine contractions will improve 12/31/2023 0033 by Larayne Platter, RN Outcome: Completed/Met 12/30/2023 2123 by Larayne Platter, RN Outcome: Progressing

## 2023-12-31 NOTE — Lactation Note (Addendum)
 This note was copied from a baby's chart. Lactation Consultation Note  Patient Name: Nicole Chase [id number] Date: 12/31/2023 Age:28 hours Reason for consult: Initial assessment;Early term 37-38.6wks  P2, Baby has had low blood sugars today and has been supplemented with formula. RN set up DEBP and will call when [facility] assistance is needed. LC did go in room, young sibling holding baby and family visiting.  Mother is having her sister bring breastmilk for baby. LC had mother sign consent for shared breastmilk.  Maternal Data Does the patient have breastfeeding experience prior to this delivery?: Yes  Feeding Mother's Current Feeding Choice: Breast Milk and Formula Nipple Type: Slow - flow Lactation Tools Discussed/Used Tools: Pump  Interventions Interventions: Education  Consult Status Consult Status: Follow-up Date: 12/31/23 Follow-up type: In-patient   Vicenta Graft Boschen  RN, IBCLC 12/31/2023, 1:30 PM

## 2023-12-31 NOTE — Anesthesia Postprocedure Evaluation (Signed)
 Anesthesia Post Note  Patient: Nicole Chase  Procedure(s) Performed: AN AD HOC EPIDURAL     Patient location during evaluation: Mother Baby Anesthesia Type: Epidural Level of consciousness: awake and alert Pain management: pain level controlled Vital Signs Assessment: post-procedure vital signs reviewed and stable Respiratory status: spontaneous breathing, nonlabored ventilation and respiratory function stable Cardiovascular status: stable Postop Assessment: no headache, no backache and epidural receding Anesthetic complications: no   No notable events documented.  Last Vitals:  Vitals:   12/31/23 0520 12/31/23 0957  BP: 134/64 (!) 108/52  Pulse: 79 75  Resp: 17 18  Temp: 36.6 C 37.1 C  SpO2: 96% 95%    Last Pain:  Vitals:   12/31/23 0957  TempSrc: Oral  PainSc: 0-No pain   Pain Goal:                   Sabrina Crandall

## 2023-12-31 NOTE — Anesthesia Postprocedure Evaluation (Signed)
 Anesthesia Post Note  Patient: Nicole Chase  Procedure(s) Performed: CESAREAN DELIVERY (Abdomen)     Patient location during evaluation: PACU Anesthesia Type: Epidural Level of consciousness: oriented and awake and alert Pain management: pain level controlled Vital Signs Assessment: post-procedure vital signs reviewed and stable Respiratory status: spontaneous breathing, respiratory function stable and nonlabored ventilation Cardiovascular status: blood pressure returned to baseline and stable Postop Assessment: no headache, no backache, no apparent nausea or vomiting, spinal receding and patient able to bend at knees Anesthetic complications: no   No notable events documented.  Last Vitals:  Vitals:   12/31/23 0152 12/31/23 0155  BP:    Pulse: 89 92  Resp: 16 15  Temp:    SpO2: 95% 95%    Last Pain:  Vitals:   12/31/23 0155  TempSrc:   PainSc: 0-No pain   Pain Goal:                Epidural/Spinal Function Cutaneous sensation: No Sensation (12/31/23 0155), Patient able to flex knees: Yes (12/31/23 0155), Patient able to lift hips off bed: Yes (12/31/23 0155), Back pain beyond tenderness at insertion site: No (12/31/23 0155), Progressively worsening motor and/or sensory loss: No (12/31/23 0155), Bowel and/or bladder incontinence post epidural: No (12/31/23 0155)  Lennette Fader A.

## 2023-12-31 NOTE — Lactation Note (Signed)
 This note was copied from a baby's chart. Lactation Consultation Note  Patient Name: Nicole Chase [id number] Date: 12/31/2023 Age:28 hours Reason for consult: Follow-up assessment;Exclusive pumping and bottle feeding;Early term 37-38.6wks  P2- MOB plans to exclusively pump only. LC set up MOB with the hospital DEBP with 18 mm flanges. LC assisted MOB with pumping for 15 minutes. MOB was able to collect 5 mL total. LC praised MOB. LC encouraged MOB to offer her EBM first, then her sister DBM that is stored in her fridge. LC reviewed CDC milk storage guidelines, LC services handout and engorgement/breast care. LC encouraged MOB to call for further assistance as needed.  Maternal Data Has patient been taught Hand Expression?: Yes Does the patient have breastfeeding experience prior to this delivery?: Yes How long did the patient breastfeed?: 2 weeks  Feeding Mother's Current Feeding Choice: Breast Milk and Donor Milk Nipple Type: Nfant Standard Flow (white)  Lactation Tools Discussed/Used Tools: Pump;Flanges Flange Size: 18 Breast pump type: Double-Electric Breast Pump;Manual Pump Education: Setup, frequency, and cleaning;Milk Storage Reason for Pumping: exclusive pumping Pumping frequency: 15-20 min every 3 hrs Pumped volume: 5 mL  Interventions Interventions: Breast feeding basics reviewed;Expressed milk;Hand pump;DEBP;Education;LC Services brochure  Discharge Discharge Education: Engorgement and breast care;Warning signs for feeding baby Pump: DEBP;Personal;Advised to call insurance company  Consult Status Consult Status: Follow-up Date: 01/01/24 Follow-up type: In-patient    Vernette Goo BS, IBCLC 12/31/2023, 7:23 PM

## 2023-12-31 NOTE — Transfer of Care (Signed)
 Immediate Anesthesia Transfer of Care Note  Patient: Nicole Chase  Procedure(s) Performed: CESAREAN DELIVERY (Abdomen)  Patient Location: PACU  Anesthesia Type:Epidural  Level of Consciousness: awake, alert , and oriented  Airway & Oxygen Therapy: Patient Spontanous Breathing  Post-op Assessment: Report given to RN and Post -op Vital signs reviewed and stable  Post vital signs: Reviewed and stable  Last Vitals:  Vitals Value Taken Time  BP 131/67 12/31/23 0106  Temp 37.3 C 12/31/23 0113  Pulse 93 12/31/23 0115  Resp 17 12/31/23 0115  SpO2 96 % 12/31/23 0115  Vitals shown include unfiled device data.  Last Pain:  Vitals:   12/31/23 0113  TempSrc: Oral  PainSc:          Complications: No notable events documented.

## 2024-01-01 LAB — GLUCOSE, CAPILLARY
Glucose-Capillary: 113 mg/dL — ABNORMAL HIGH (ref 70–99)
Glucose-Capillary: 107 mg/dL — ABNORMAL HIGH (ref 70–99)
Glucose-Capillary: 114 mg/dL — ABNORMAL HIGH (ref 70–99)

## 2024-01-01 NOTE — Progress Notes (Signed)
 No c/o, pain controlled, tol po, ambulating +flatus, nml lochia Breastfeeding, boy  Patient Vitals for the past 24 hrs:  BP Temp Temp src Pulse Resp SpO2  01/01/24 0519 121/68 -- -- 67 17 99 %  12/31/23 2230 (!) 110/53 99.8 F (37.7 C) Oral 88 16 98 %  12/31/23 1800 120/71 98.7 F (37.1 C) Oral 76 18 97 %  12/31/23 1336 124/73 99.7 F (37.6 C) Oral 71 20 96 %  12/31/23 0957 (!) 108/52 98.8 F (37.1 C) Oral 75 18 95 %   A&ox3 Rrr Ctab Abd: soft,nt,nd, +bs, fundus firm and below umb; dressing c/d/I LE: no edema,nt bilat     Latest Ref Rng & Units 12/31/2023    5:16 AM 12/30/2023    6:38 AM 06/26/2022   10:41 AM  CBC  WBC 4.0 - 10.5 K/uL 16.0  9.2  10.4   Hemoglobin 12.0 - 15.0 g/dL 28.4  13.2  44.0   Hematocrit 36.0 - 46.0 % 34.1  35.8  42.7   Platelets 150 - 400 K/uL 189  212  312    A/P: pod1 s/p 1ltcs (CPD) Doing well, contin care Gdma2 - 2 hr pp lunch 124, dinner 120, fasting 113; contin diabetic diet and following bs Von Hippel-Lindau disease - stable, followed by [facility] Rh pos Rubella immune Anemia of pregnancy - stable, asymptomatic, not sig - iron pp Boy - breastfeeding, wants circ; plan today; reviewed r/b/a procedure with pt

## 2024-01-01 NOTE — Lactation Note (Signed)
 This note was copied from a baby's chart. Lactation Consultation Note  Patient Name: Boy Tvisha Schwoerer [id number] Date: 01/01/2024 Age:28 hours Reason for consult: Exclusive pumping and bottle feeding, infant's weight loss -5.55% MOB informed LC infant is improving with feedings no longer gagging during feeding and SLP helped a lot. Infant is using ultra preemie Dr. Maralyn Sender bottle and nipple and infant current intake of donor milk is 25 to 30 mls per feeding on day 2 of life. MOB recently pumped and expressed 15 mls that she was bottle feeding to infant when LC was in the room.  MOB plans to start pumping every 3 hours for 15 minutes on initial setting. MOB used DEBP twice today. But will start pumping every 3 hours for 15 minutes on initial setting, MOB feeding choice is "pumping only". MOB will continue to feed infant by cues, on demand,8+ times within 24 hours.   Maternal Data    Feeding Mother's Current Feeding Choice: Breast Milk and Donor Milk  LATCH Score                    Lactation Tools Discussed/Used Flange Size: 18 Pumping frequency: MOB plans to start pumping every 3 hours for 15 minutes now that she is starting to express more volume when pumping. Pumped volume: 15 mL  Interventions Interventions: Expressed milk;Education;Pace feeding;CDC milk storage guidelines;CDC Guidelines for Breast Pump Cleaning;Guidelines for Milk Supply and Pumping Schedule Handout  Discharge    Consult Status Consult Status: Follow-up Date: 01/02/24 Follow-up type: In-patient    Pecolia Bourbon 01/01/2024, 11:50 PM

## 2024-01-01 NOTE — Progress Notes (Signed)
 MOB was referred for history of Anxiety/ADD.  * Referral screened out by Clinical Social Worker because none of the following criteria appear to apply:  ~ History of Anxiety during this pregnancy, or of post-partum depression following prior delivery.  ~ Diagnosis of Anxiety within last 3 years  Per OB notes, MOB did not indicate any signs/symptoms during her pregnancy. Anxiety/ADD 2020  OR  * MOB's symptoms currently being treated with medication and/or therapy.  Please contact the Clinical Social Worker if needs arise, by [facility] request, or if MOB scores greater than 9/yes to question 10 on Edinburgh Postpartum Depression Screen.  Nicole Chase, Milinda Allen Clinical Social Worker [phone]

## 2024-01-02 LAB — GLUCOSE, CAPILLARY
Glucose-Capillary: 81 mg/dL (ref 70–99)
Glucose-Capillary: 104 mg/dL — ABNORMAL HIGH (ref 70–99)
Glucose-Capillary: 112 mg/dL — ABNORMAL HIGH (ref 70–99)

## 2024-01-02 MED ORDER — NIFEDIPINE ER OSMOTIC RELEASE 30 MG PO TB24
30.0000 mg | ORAL_TABLET | Freq: Every day | ORAL | Status: DC
  Administered 2024-01-02 – 2024-01-03 (×2): 30 mg via ORAL
  Filled 2024-01-02 (×2): qty 1

## 2024-01-02 NOTE — Lactation Note (Signed)
 This note was copied from a baby's chart. Lactation Consultation Note  Patient Name: Nicole Chase [id number] Date: 01/02/2024 Age:28 hours Reason for consult: Follow-up assessment;Exclusive pumping and bottle feeding  P2, Mother's breasts are filling and she has questions. She pumped x 2 today and expressed 40ml (20 ml per side). Praised her for her efforts.  Encouraged her to continue pumping q 3 hours for 15 min. Discussed applying ice for engorgement (not yet) and warm moist cloths if breastmilk is not flowing with pumping. Reviewed gentle breast massage only.   Maternal Data Has patient been taught Hand Expression?: Yes  Feeding Mother's Current Feeding Choice: Breast Milk and Donor Milk  Lactation Tools Discussed/Used Tools: Pump Flange Size: 18 Breast pump type: Double-Electric Breast Pump;Manual Reason for Pumping: Mother's choice Pumping frequency: Recommend pumping q 3 hours for 15 min Pumped volume: 40 mL  Interventions Interventions: DEBP;Education  Discharge    Consult Status Consult Status: Follow-up Date: 01/03/24 Follow-up type: In-patient    Vicenta Graft [facility] 01/02/2024, 2:56 PM

## 2024-01-02 NOTE — Progress Notes (Addendum)
 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 Wants to monitor and not start med yet   Breastfeeding, boy, s/p circ. Some wt loss and low BS, being monitored   Patient Vitals for the past 24 hrs:  BP Temp Temp src Pulse Resp SpO2  01/02/24 0518 131/77 97.9 F (36.6 C) Oral 96 17 100 %  01/01/24 2210 137/67 -- -- 89 -- --  01/01/24 2110 (!) 141/78 98.6 F (37 C) Oral 99 17 99 %  01/01/24 1600 128/73 99 F (37.2 C) -- 90 17 100 %   A&ox3 Rrr Ctab Abd: soft,nt,nd, +bs, fundus firm and below umb; dressing c/d/I LE: no edema,nt bilat     Latest Ref Rng & Units 12/31/2023    5:16 AM 12/30/2023    6:38 AM 06/26/2022   10:41 AM  CBC  WBC 4.0 - 10.5 K/uL 16.0  9.2  10.4   Hemoglobin 12.0 - 15.0 g/dL 40.9  81.1  91.4   Hematocrit 36.0 - 46.0 % 34.1  35.8  42.7   Platelets 150 - 400 K/uL 189  212  312    A/P: pod 2 s/p 1ltcs (CPD) Doing well, contin care Elevated BPs, started Procardia  30mg  XL this morning. 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

## 2024-01-03 ENCOUNTER — Other Ambulatory Visit: Payer: Self-pay | Admitting: Obstetrics

## 2024-01-03 LAB — GLUCOSE, CAPILLARY: Glucose-Capillary: 92 mg/dL (ref 70–99)

## 2024-01-03 MED ORDER — OXYCODONE HCL 5 MG PO TABS: 5.0000 mg | ORAL_TABLET | ORAL | 0 refills | Status: DC | PRN

## 2024-01-03 MED ORDER — ESOMEPRAZOLE MAGNESIUM 40 MG PO CPDR: 40.0000 mg | DELAYED_RELEASE_CAPSULE | Freq: Every day | ORAL | 1 refills | Status: AC

## 2024-01-03 MED ORDER — SERTRALINE HCL 50 MG PO TABS: 50.0000 mg | ORAL_TABLET | Freq: Every day | ORAL | 0 refills | Status: DC

## 2024-01-03 MED ORDER — OXYCODONE HCL 5 MG PO TABS
5.0000 mg | ORAL_TABLET | ORAL | 0 refills | Status: DC | PRN
Start: 2024-01-03 — End: 2024-01-03

## 2024-01-03 MED ORDER — NIFEDIPINE ER OSMOTIC RELEASE 30 MG PO TB24: 30.0000 mg | ORAL_TABLET | Freq: Every day | ORAL | 1 refills | Status: DC

## 2024-01-03 NOTE — Discharge Instructions (Signed)
 Congratulations! We hope you have a wonderful postpartum period. We will plan to see you in the office in 2-3 days for BP check and 6 weeks. Please call the office if you experience fevers (>100.4 F), heavy vaginal bleeding (using >2 pads/hour), severe pain not responsive to ibuprofen (Advil or Motrin) and acetaminophen (Tylenol), chest pain, shortness of breath, or if you have pain, redness, or swelling in one or both legs. Mood swings, fatigue, and feeling down can be normal in the first two weeks following birth. If you feel your mood symptoms are severe or lasting longer than two weeks, please call our office. If you have any thoughts of hurting yourself or others please call our office. Please call your pediatrician if you have any concerns about your baby.   You should remove your wound dressing 5 days after delivery. There will be small pieces of tape, called steri strips, underneath the dressing. You can remove these if they begin to peel off on their own, otherwise we will remove them at your 2 week follow-up visit. After removing the dressing please keep the area dry and open to the air. Please call the office if you notice new redness, pain, swelling, or drainage around the incision.   Please take your blood pressure twice a day, morning and evening, and record your results. Please call the office if your blood pressure is >160 on the top number or >110 on the bottom number. Please also call the office if you experience headaches not responsive to medication, flashing lights in your vision, chest pain, shortness of breath, right upper quadrant pain, or new/worsening/severe lower leg swelling.

## 2024-01-03 NOTE — Discharge Summary (Signed)
 Postpartum Discharge Summary    Patient Name: Nicole Chase DOB: 1995-11-20 MRN: [id number]  Date of admission: 12/30/2023 Delivery date:12/31/2023 Delivering provider: Meriam Stamp Date of discharge: 01/03/2024  Admitting diagnosis: Encounter for induction of labor [Z34.90] Intrauterine pregnancy: [redacted]w[redacted]d     Secondary diagnosis:  Active Problems:   Gestational hypertension   Cesarean delivery delivered  Additional problems: None    Discharge diagnosis: Term Pregnancy Delivered and Gestational Hypertension                                              Post partum procedures: none Augmentation: N/A Complications: None  Hospital course: Sceduled C/S   28 y.o. yo [id number] at [redacted]w[redacted]d was admitted to the hospital 12/30/2023 for scheduled cesarean section with the following indication:Prior Uterine Surgery.Delivery details are as follows:  Membrane Rupture Time/Date: 11:44 AM,12/30/2023  Delivery Method:C-Section, Low Transverse Operative Delivery:N/A Details of operation can be found in separate operative note.  Patient had a postpartum course complicated by gHTN well controlled on procardia  30XL daily.  She is ambulating, tolerating a regular diet, passing flatus, and urinating well. Patient is discharged home in stable condition on  01/03/24        Newborn Data: Birth date:12/31/2023 Birth time:12:14 AM Gender:Female Living status:Living Apgars:8 ,9  Weight:3980 g    Magnesium Sulfate received: No BMZ received: No Rhophylac:N/A MMR:N/A T-DaP:Given prenatally Flu: N/A RSV Vaccine received: No Transfusion:No Immunizations administered: There is no immunization history for the selected administration types on file for this patient.  Physical exam  Vitals:   01/02/24 1556 01/02/24 2209 01/02/24 2315 01/03/24 0532  BP: 136/71 137/81 125/70 113/60  Pulse:  (!) 101 95 92  Resp:  17 17 16   Temp:  97.9 F (36.6 C)  98 F (36.7 C)  TempSrc:  Oral  Oral  SpO2:  100%  98%  Weight:       Height:       General: alert, cooperative, and no distress Lochia: appropriate Uterine Fundus: firm Incision: Healing well with no significant drainage DVT Evaluation: No evidence of DVT seen on physical exam. Labs: Lab Results  Component Value Date   WBC 16.0 (H) 12/31/2023   HGB 11.1 (L) 12/31/2023   HCT 34.1 (L) 12/31/2023   MCV 89.3 12/31/2023   PLT 189 12/31/2023      Latest Ref Rng & Units 12/30/2023    6:38 AM  CMP  Glucose 70 - 99 mg/dL 578   BUN 6 - 20 mg/dL 10   Creatinine 4.69 - 1.00 mg/dL 6.29   Sodium 528 - 413 mmol/L 135   Potassium 3.5 - 5.1 mmol/L 3.6   Chloride 98 - 111 mmol/L 105   CO2 22 - 32 mmol/L 18   Calcium 8.9 - 10.3 mg/dL 9.4   Total Protein 6.5 - 8.1 g/dL 6.4   Total Bilirubin 0.0 - 1.2 mg/dL 0.5   Alkaline Phos 38 - 126 U/L 90   AST 15 - 41 U/L 36   ALT 0 - 44 U/L 27    Edinburgh Score:    12/31/2023    2:20 AM  Edinburgh Postnatal Depression Scale Screening Tool  I have been able to laugh and see the funny side of things. 0  I have looked forward with enjoyment to things. 0  I have blamed myself unnecessarily when things went wrong. 1  I have been anxious or worried for no good reason. 2  I have felt scared or panicky for no good reason. 1  Things have been getting on top of me. 1  I have been so unhappy that I have had difficulty sleeping. 1  I have felt sad or miserable. 1  I have been so unhappy that I have been crying. 1  The thought of harming myself has occurred to me. 0  Edinburgh Postnatal Depression Scale Total 8      After visit meds:  Allergies as of 01/03/2024       Reactions   Keflex [cephalexin] Hives        Medication List     STOP taking these medications    butalbital -acetaminophen -caffeine  50-325-40 MG tablet Commonly known as: FIORICET    metFORMIN  500 MG tablet Commonly known as: GLUCOPHAGE    prenatal multivitamin Tabs tablet       TAKE these medications    esomeprazole 40 MG  capsule Commonly known as: NEXIUM Take 40 mg by mouth daily at 8 pm.   NIFEdipine  30 MG 24 hr tablet Commonly known as: PROCARDIA -XL/NIFEDICAL-XL Take 1 tablet (30 mg total) by mouth daily. Start taking on: January 04, 2024   oxyCODONE  5 MG immediate release tablet Commonly known as: Oxy IR/ROXICODONE  Take 1 tablet (5 mg total) by mouth every 4 (four) hours as needed for breakthrough pain.   sertraline 50 MG tablet Commonly known as: Zoloft Take 1 tablet (50 mg total) by mouth daily.         Discharge home in stable condition Infant Feeding: Bottle and Breast Infant Disposition:home with mother Discharge instruction: per After Visit Summary and Postpartum booklet. Activity: Advance as tolerated. Pelvic rest for 6 weeks.  Diet: routine diet Anticipated Birth Control: Unsure Postpartum Appointment:6 weeks Additional Postpartum F/U: BP check 2-3 days Future Appointments:No future appointments. Follow up Visit:  Follow-up Information     Obgyn, [place]. Schedule an appointment as soon as possible for a visit in 3 day(s).   Why: 3d for blood pressure check and 6w for postpartum visit Contact information: [address] [place] [address] [phone]                     01/03/2024 Delberta Fee, MD

## 2024-01-03 NOTE — Lactation Note (Signed)
 This note was copied from a baby's chart. Lactation Consultation Note  Patient Name: Nicole Chase [id number] Date: 01/03/2024 Age:28 days, P2  Reason for consult: Follow-up assessment;Exclusive pumping and bottle feeding;Nipple pain/trauma;Early term 37-38.6wks;Infant weight loss (8 % weight loss, Per mom bilirubin was recentluy drawn) Per mom spitting has improved. MOB's concern was during the night was that after 15 ml the baby was sleepy and it was hard to feed the rest of the milk. Per mom even undressing the baby.  Per mom the most she has pumped off is 40 ml ( 20 ml each breast ).  LC praised mom for her consistent pumping and mentioned that is a good sign the milk is coming in.  LC reviewed engorgement prevention and tx.  Mom mentioned she was measured for a #52 F and she is feeling sore.  LC offered to assess breast tissue and LC noted swelling at the base of the nipples. LC recommended a dab of coconut oil prior to pumping and shells between feeding even if she isn't latching. Also increase flange to #21 F until the soreness improves.  Reviewed supply and demand , importance of pumping 8-10 times a day around the clock.  Per mom has Medela. LC reviewed Breast feeding pumping , and D/C information.  Per mom was told by the NP to feed the baby every 2 hours and decrease to 30 ml a feeding.  SLP came to see dyad while LC in the room and will becoming back for a feeding assessment around 1:30 pm per mom.  Dyad is for potential discharge after assessment by SLP and serum bilirubin .    Maternal Data Has patient been taught Hand Expression?: Yes  Feeding Mother's Current Feeding Choice: Breast Milk and Donor Milk Nipple Type: Dr. Johann Muta    Lactation Tools Discussed/Used Tools: Shells;Pump;Flanges Flange Size: 18;21 Breast pump type: Double-Electric Breast Pump;Manual Pump Education: Milk Storage;Setup, frequency, and cleaning (already has been pumping and milk is  coming in) Pumped volume: 40 mL  Interventions Interventions: Breast feeding basics reviewed;Shells;Hand pump;DEBP;Coconut oil;Education;LC Services brochure;CDC milk storage guidelines;CDC Guidelines for Breast Pump Cleaning  Discharge Discharge Education: Engorgement and breast care;Warning signs for feeding baby Pump: Personal;DEBP;Manual (per mom has a  Medela)  Consult Status Consult Status: Complete Date: 01/03/24    Richarda Chance 01/03/2024, 12:22 PM

## 2024-01-03 NOTE — Plan of Care (Signed)
 Problem: Education: Goal: Knowledge of the prescribed therapeutic regimen will improve 01/03/2024 1146 by Osvaldo Blender, LPN Outcome: Adequate for Discharge 01/03/2024 1610 by Osvaldo Blender, LPN Outcome: Progressing Goal: Understanding of sexual limitations or changes related to disease process or condition will improve 01/03/2024 1146 by Osvaldo Blender, LPN Outcome: Adequate for Discharge 01/03/2024 9604 by Osvaldo Blender, LPN Outcome: Progressing Goal: Individualized Educational Video(s) 01/03/2024 1146 by Osvaldo Blender, LPN Outcome: Adequate for Discharge 01/03/2024 5409 by Osvaldo Blender, LPN Outcome: Progressing   Problem: Self-Concept: Goal: Communication of feelings regarding changes in body function or appearance will improve 01/03/2024 1146 by Osvaldo Blender, LPN Outcome: Adequate for Discharge 01/03/2024 8119 by Osvaldo Blender, LPN Outcome: Progressing   Problem: Skin Integrity: Goal: Demonstration of wound healing without infection will improve 01/03/2024 1146 by Osvaldo Blender, LPN Outcome: Adequate for Discharge 01/03/2024 1478 by Osvaldo Blender, LPN Outcome: Progressing   Problem: Education: Goal: Knowledge of condition will improve 01/03/2024 1146 by Osvaldo Blender, LPN Outcome: Adequate for Discharge 01/03/2024 2956 by Osvaldo Blender, LPN Outcome: Progressing Goal: Individualized Educational Video(s) 01/03/2024 1146 by Osvaldo Blender, LPN Outcome: Adequate for Discharge 01/03/2024 2130 by Osvaldo Blender, LPN Outcome: Progressing Goal: Individualized Newborn Educational Video(s) 01/03/2024 1146 by Osvaldo Blender, LPN Outcome: Adequate for Discharge 01/03/2024 8657 by Osvaldo Blender, LPN Outcome: Progressing   Problem: Activity: Goal: Will verbalize the importance of balancing activity with adequate rest periods 01/03/2024 1146 by Osvaldo Blender, LPN Outcome: Adequate for Discharge 01/03/2024 8469 by Osvaldo Blender, LPN Outcome: Progressing Goal: Ability to  tolerate increased activity will improve 01/03/2024 1146 by Osvaldo Blender, LPN Outcome: Adequate for Discharge 01/03/2024 6295 by Osvaldo Blender, LPN Outcome: Progressing   Problem: Coping: Goal: Ability to identify and utilize available resources and services will improve 01/03/2024 1146 by Osvaldo Blender, LPN Outcome: Adequate for Discharge 01/03/2024 2841 by Osvaldo Blender, LPN Outcome: Progressing   Problem: Life Cycle: Goal: Chance of risk for complications during the postpartum period will decrease 01/03/2024 1146 by Osvaldo Blender, LPN Outcome: Adequate for Discharge 01/03/2024 3244 by Osvaldo Blender, LPN Outcome: Progressing   Problem: Role Relationship: Goal: Ability to demonstrate positive interaction with newborn will improve 01/03/2024 1146 by Osvaldo Blender, LPN Outcome: Adequate for Discharge 01/03/2024 0102 by Osvaldo Blender, LPN Outcome: Progressing   Problem: Skin Integrity: Goal: Demonstration of wound healing without infection will improve 01/03/2024 1146 by Osvaldo Blender, LPN Outcome: Adequate for Discharge 01/03/2024 7253 by Osvaldo Blender, LPN Outcome: Progressing   Problem: Education: Goal: Ability to describe self-care measures that may prevent or decrease complications (Diabetes Survival Skills Education) will improve 01/03/2024 1146 by Osvaldo Blender, LPN Outcome: Adequate for Discharge 01/03/2024 6644 by Osvaldo Blender, LPN Outcome: Progressing Goal: Individualized Educational Video(s) 01/03/2024 1146 by Osvaldo Blender, LPN Outcome: Adequate for Discharge 01/03/2024 0347 by Osvaldo Blender, LPN Outcome: Progressing   Problem: Coping: Goal: Ability to adjust to condition or change in health will improve 01/03/2024 1146 by Osvaldo Blender, LPN Outcome: Adequate for Discharge 01/03/2024 4259 by Osvaldo Blender, LPN Outcome: Progressing   Problem: Fluid Volume: Goal: Ability to maintain a balanced intake and output will improve 01/03/2024 1146 by Osvaldo Blender, LPN Outcome: Adequate for Discharge 01/03/2024 5638 by Osvaldo Blender, LPN Outcome: Progressing   Problem: Health Behavior/Discharge Planning: Goal: Ability to identify and utilize available resources  and services will improve 01/03/2024 1146 by Osvaldo Blender, LPN Outcome: Adequate for Discharge 01/03/2024 7425 by Osvaldo Blender, LPN Outcome: Progressing Goal: Ability to manage health-related needs will improve 01/03/2024 1146 by Osvaldo Blender, LPN Outcome: Adequate for Discharge 01/03/2024 9563 by Osvaldo Blender, LPN Outcome: Progressing   Problem: Metabolic: Goal: Ability to maintain appropriate glucose levels will improve 01/03/2024 1146 by Osvaldo Blender, LPN Outcome: Adequate for Discharge 01/03/2024 0712 by Osvaldo Blender, LPN Outcome: Progressing   Problem: Nutritional: Goal: Maintenance of adequate nutrition will improve Outcome: Adequate for Discharge Goal: Progress toward achieving an optimal weight will improve Outcome: Adequate for Discharge   Problem: Skin Integrity: Goal: Risk for impaired skin integrity will decrease Outcome: Adequate for Discharge   Problem: Tissue Perfusion: Goal: Adequacy of tissue perfusion will improve Outcome: Adequate for Discharge

## 2024-01-03 NOTE — Plan of Care (Signed)
  Problem: Education: Goal: Knowledge of the prescribed therapeutic regimen will improve Outcome: Progressing Goal: Understanding of sexual limitations or changes related to disease process or condition will improve Outcome: Progressing Goal: Individualized Educational Video(s) Outcome: Progressing   Problem: Self-Concept: Goal: Communication of feelings regarding changes in body function or appearance will improve Outcome: Progressing   Problem: Skin Integrity: Goal: Demonstration of wound healing without infection will improve Outcome: Progressing   Problem: Education: Goal: Knowledge of condition will improve Outcome: Progressing Goal: Individualized Educational Video(s) Outcome: Progressing Goal: Individualized Newborn Educational Video(s) Outcome: Progressing   Problem: Activity: Goal: Will verbalize the importance of balancing activity with adequate rest periods Outcome: Progressing Goal: Ability to tolerate increased activity will improve Outcome: Progressing   Problem: Coping: Goal: Ability to identify and utilize available resources and services will improve Outcome: Progressing   Problem: Life Cycle: Goal: Chance of risk for complications during the postpartum period will decrease Outcome: Progressing   Problem: Role Relationship: Goal: Ability to demonstrate positive interaction with newborn will improve Outcome: Progressing   Problem: Skin Integrity: Goal: Demonstration of wound healing without infection will improve Outcome: Progressing   Problem: Education: Goal: Ability to describe self-care measures that may prevent or decrease complications (Diabetes Survival Skills Education) will improve Outcome: Progressing Goal: Individualized Educational Video(s) Outcome: Progressing   Problem: Coping: Goal: Ability to adjust to condition or change in health will improve Outcome: Progressing   Problem: Fluid Volume: Goal: Ability to maintain a balanced  intake and output will improve Outcome: Progressing   Problem: Health Behavior/Discharge Planning: Goal: Ability to identify and utilize available resources and services will improve Outcome: Progressing Goal: Ability to manage health-related needs will improve Outcome: Progressing   Problem: Metabolic: Goal: Ability to maintain appropriate glucose levels will improve Outcome: Progressing

## 2024-01-03 NOTE — Progress Notes (Signed)
 Postpartum Progress Note  POD#3 s/p PCS  S: Patient seen and examined at bedside. Reports feeling overall well. Pain well controlled, ambulating, tolerating regular diet, voiding without issue. Passing flatus and having regular bowel movement. Denies fever, chills, chest pain, shortness of breath.  Feeding: plans to breastfeed Circ: S/p circumcision  O:  Vitals:   01/02/24 2315 01/03/24 0532  BP: 125/70 113/60  Pulse: 95 92  Resp: 17 16  Temp:  98 F (36.7 C)  SpO2:  98%    PE:  GA: well appearing, NAD CV: RRR, normal S1, S2 Lungs: CTAB Abd: soft, appropriately tender, fundus firm below umbilicus Inc: dressing removed, steri strips in place, clean/dry/intact Peri: moderate lochia Ext: no TTP, +1 non-pitting edema  Labs:  Lab Results  Component Value Date   WBC 16.0 (H) 12/31/2023   HGB 11.1 (L) 12/31/2023   HCT 34.1 (L) 12/31/2023   MCV 89.3 12/31/2023   PLT 189 12/31/2023   Lab Results  Component Value Date   CREATININE 0.75 12/30/2023    A/P:  28 y.o.yo [id number] POD#3 s/p PCS (EBL ) with gHTN on nifedipine  30mg  XL daily, doing well and progressing appropriately. Vitals within normal limits. Physical exam benign. Labs normal. 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    Ellsworth Haas, MD

## 2024-01-10 ENCOUNTER — Telehealth (HOSPITAL_COMMUNITY): Payer: Self-pay

## 2024-01-10 NOTE — Telephone Encounter (Signed)
 01/10/2024 1845  Name: Nicole Chase MRN: [id number] DOB: Mar 27, 1996  Reason for Call:  Transition of Care Hospital Discharge Call  Contact Status: Patient Contact Status: Message  Language assistant needed:          Follow-Up Questions:    Dimple Francis Postnatal Depression Scale:  In the Past 7 Days:    PHQ2-9 Depression Scale:     Discharge Follow-up:    Post-discharge interventions: NA  Signature  Wadell Guild

## 2024-09-07 ENCOUNTER — Ambulatory Visit: Admitting: Physician Assistant

## 2024-09-07 ENCOUNTER — Encounter: Payer: Self-pay | Admitting: Physician Assistant

## 2024-09-07 ENCOUNTER — Ambulatory Visit: Payer: Self-pay

## 2024-09-07 VITALS — BP 122/72 | HR 86 | Temp 97.8°F | Ht 68.0 in | Wt 222.0 lb

## 2024-09-07 DIAGNOSIS — R051 Acute cough: Secondary | ICD-10-CM

## 2024-09-07 LAB — POC COVID19 BINAXNOW: SARS Coronavirus 2 Ag: NEGATIVE

## 2024-09-07 MED ORDER — AZITHROMYCIN 250 MG PO TABS: ORAL_TABLET | ORAL | 0 refills | Status: AC

## 2024-09-07 MED ORDER — SERTRALINE HCL 50 MG PO TABS: 50.0000 mg | ORAL_TABLET | Freq: Every day | ORAL | 0 refills | Status: AC

## 2024-09-07 NOTE — Telephone Encounter (Signed)
 FYI Only or Action Required?: FYI only for provider: appointment scheduled on 09/07/2024 at 9:40 AM.  Patient was last seen in primary care on 07/28/2023 by Nicholaus Credit, PA-C.  Called Nurse Triage reporting Shortness of Breath.  Symptoms began several days ago.  Interventions attempted: Rest, hydration, or home remedies.  Symptoms are: unchanged.  Triage Disposition: See HCP Within 4 Hours (Or PCP Triage)  Patient/caregiver understands and will follow disposition?: Yes  Copied from CRM #[id number]. Topic: Clinical - Red Word Triage >> Sep 07, 2024  7:46 AM Amber H wrote: [facility] that prompted transfer to Nurse Triage: Patient complaining of wheezing, congested cough for over a month, super fatigued, a little SOB. Stated her kids had the flu the week of  Christmas but she did not have a fever like they did. Reason for Disposition  [1] MILD difficulty breathing (e.g., minimal/no SOB at rest, SOB with walking, pulse < 100) AND [2] NEW-onset or WORSE than normal  Answer Assessment - Initial Assessment Questions Reports congested cough for a month. Kids came down with the flu over christmas-shortness of breath and wheezing started over the weekend. Patient endorses fatigue as well. Scheduled to see provider in office this morning at 9:40 AM.  1. RESPIRATORY STATUS: Describe your breathing? (e.g., wheezing, shortness of breath, unable to speak, severe coughing)      Shortness of breath, wheezing 2. ONSET: When did this breathing problem begin?      Shortness of breath and wheezing started over the weekend 3. PATTERN Does the difficult breathing come and go, or has it been constant since it started?      Comes and goes 4. SEVERITY: How bad is your breathing? (e.g., mild, moderate, severe)      mild 5. RECURRENT SYMPTOM: Have you had difficulty breathing before? If Yes, ask: When was the last time? and What happened that time?      no 6. CARDIAC HISTORY: Do you have any  history of heart disease? (e.g., heart attack, angina, bypass surgery, angioplasty)      no 7. LUNG HISTORY: Do you have any history of lung disease?  (e.g., pulmonary embolus, asthma, emphysema)     no 8. CAUSE: What do you think is causing the breathing problem?      unsure 9. OTHER SYMPTOMS: Do you have any other symptoms? (e.g., chest pain, cough, dizziness, fever, runny nose)     Congested cough, fatigue 10. O2 SATURATION MONITOR:  Do you use an oxygen saturation monitor (pulse oximeter) at home? If Yes, ask: What is your reading (oxygen level) today? What is your usual oxygen saturation reading? (e.g., 95%)       no 11. PREGNANCY: Is there any chance you are pregnant? When was your last menstrual period?       no 12. TRAVEL: Have you traveled out of the country in the last month? (e.g., travel history, exposures)       no  Protocols used: Breathing Difficulty-A-AH

## 2024-09-07 NOTE — Progress Notes (Signed)
 "  Acute Office Visit  Subjective:    Patient ID: Nicole Chase, female    DOB: [birth year], 28 y.o.   MRN: [id number]  Chief Complaint  Patient presents with   Cough/wheezing    HPI: Patient is in today for fatigue, sinus congestion, cough.   Discussed the use of AI scribe software for clinical note transcription with the patient, who gave verbal consent to proceed.   History of Present Illness Nicole Chase is a 28 year old female who presents with shortness of breath, cough, and sinus congestion.  She has been experiencing intermittent shortness of breath, cough, and sinus congestion since the beginning of December. Recently, she has noted increased fatigue. No fever, but significant sinus congestion and a productive cough, described as 'coughing up a lot of stuff'.  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 Due to breastfeeding, she has been cautious with medication use, opting to take Airborne. She has a known allergy to Keflex.  She confirms a negative COVID test and denies any nausea or vomiting. Her children are eight months and four years old, and she had to cancel holiday plans due to illness in the family.  She requests a refill of Zoloft .      Past Medical History:  Diagnosis Date   Exertional dyspnea 12/29/2019   Gestational hypertension 12/29/2019   Hearing loss    Palpitations 12/29/2019   Pancreatic cyst    Pre-eclampsia    Pregnancy induced hypertension    VHL (von Hippel-Lindau syndrome) (HCC)     Past Surgical History:  Procedure Laterality Date   CESAREAN SECTION N/A 12/30/2023   Procedure: CESAREAN DELIVERY;  Surgeon: Gorge Ade, MD;  Location: [facility] ORS;  Service: Obstetrics;  Laterality: N/A;   WISDOM TOOTH EXTRACTION      Family History  Problem Relation Age of Onset   Healthy Mother    Von Hippel-Lindau  syndrome Mother    Hearing loss Mother    Healthy Father    Von Hippel-Lindau syndrome Maternal Grandmother    Breast cancer Paternal Grandmother    Von Hippel-Lindau syndrome Maternal Uncle    Von Hippel-Lindau syndrome Maternal Aunt     Social History   Socioeconomic History   Marital status: Single    Spouse name: Not on file   Number of children: 1   Years of education: Not on file   Highest education level: Not on file  Occupational History   Occupation: RN  Tobacco Use   Smoking status: Never   Smokeless tobacco: Never  Vaping Use   Vaping status: Never Used  Substance and Sexual Activity   Alcohol use: Yes    Alcohol/week: 3.0 standard drinks of alcohol    Types: 3 Glasses of wine per week    Comment: ocassionally   Drug use: No   Sexual activity: Yes    Partners: Male    Birth control/protection: None  Other Topics Concern   Not on file  Social History Narrative   Not on file   Social Drivers of Health   Tobacco Use: Low Risk (09/07/2024)   Patient History    Smoking Tobacco Use: Never    Smokeless Tobacco Use: Never    Passive Exposure: Not on file  Financial Resource Strain: Not on file  Food Insecurity: No Food Insecurity (12/30/2023)   Hunger Vital Sign  Worried About Programme Researcher, Broadcasting/film/video in the Last Year: Never true    Ran Out of Food in the Last Year: Never true  Transportation Needs: No Transportation Needs (12/30/2023)   PRAPARE - Administrator, Civil Service (Medical): No    Lack of Transportation (Non-Medical): No  Physical Activity: Not on file  Stress: Not on file  Social Connections: Unknown (01/22/2022)   Received from [facility]   Social Network    Social Network: Not on file  Intimate Partner Violence: Not At Risk (12/30/2023)   Humiliation, Afraid, Rape, and Kick questionnaire    Fear of Current or Ex-Partner: No    Emotionally Abused: No    Physically Abused: No    Sexually Abused: No  Depression (PHQ2-9): Low  Risk (10/29/2023)   Depression (PHQ2-9)    PHQ-2 Score: 0  Alcohol Screen: Not on file  Housing: Low Risk (01/03/2024)   Housing Stability Vital Sign    Unable to Pay for Housing in the Last Year: No    Number of Times Moved in the Last Year: 0    Homeless in the Last Year: No  Utilities: Not At Risk (12/30/2023)   AHC Utilities    Threatened with loss of utilities: No  Health Literacy: Not on file    Outpatient Medications Prior to Visit  Medication Sig Dispense Refill   sertraline  (ZOLOFT ) 50 MG tablet Take 1 tablet (50 mg total) by mouth daily. 90 tablet 0   esomeprazole  (NEXIUM ) 40 MG capsule Take 1 capsule (40 mg total) by mouth daily at 8 pm. 90 capsule 1   NIFEdipine  (PROCARDIA -XL/NIFEDICAL-XL) 30 MG 24 hr tablet Take 1 tablet (30 mg total) by mouth daily. 90 tablet 1   oxyCODONE  (OXY IR/ROXICODONE ) 5 MG immediate release tablet Take 1 tablet (5 mg total) by mouth every 4 (four) hours as needed for breakthrough pain. 15 tablet 0   No facility-administered medications prior to visit.    Allergies[1]  Review of Systems  Constitutional:  Positive for fatigue. Negative for appetite change and fever.  HENT:  Positive for congestion. Negative for ear pain, sinus pressure and sore throat.   Respiratory:  Positive for cough and wheezing. Negative for chest tightness and shortness of breath.   Cardiovascular:  Negative for chest pain and palpitations.  Gastrointestinal:  Negative for abdominal pain, constipation, diarrhea, nausea and vomiting.  Genitourinary:  Negative for dysuria and hematuria.  Musculoskeletal:  Negative for arthralgias, back pain, joint swelling and myalgias.  Skin:  Negative for rash.  Neurological:  Negative for dizziness, weakness and headaches.  Psychiatric/Behavioral:  Negative for dysphoric mood. The patient is not nervous/anxious.        Objective:        09/07/2024    9:49 AM 01/03/2024    5:32 AM 01/02/2024   11:15 PM  Vitals with BMI  Height 5'  8    Weight 222 lbs    BMI 33.76    Systolic 122 113 874  Diastolic 72 60 70  Pulse 86 92 95    Orthostatic VS for the past 72 hrs (Last 3 readings):  Patient Position BP Location  09/07/24 0949 Sitting Left Arm     Physical Exam Vitals reviewed.  Constitutional:      Appearance: Normal appearance.  HENT:     Right Ear: Hearing normal. A middle ear effusion is present. Tympanic membrane is erythematous and bulging.     Left Ear: Hearing normal. A middle  ear effusion is present. Tympanic membrane is erythematous and bulging.     Mouth/Throat:     Pharynx: Posterior oropharyngeal erythema present. No oropharyngeal exudate.  Neck:     Vascular: No carotid bruit.  Cardiovascular:     Rate and Rhythm: Normal rate and regular rhythm.     Heart sounds: Normal heart sounds.  Pulmonary:     Effort: Pulmonary effort is normal.     Breath sounds: Normal breath sounds.  Abdominal:     General: Bowel sounds are normal.     Palpations: Abdomen is soft.     Tenderness: There is no abdominal tenderness.  Neurological:     Mental Status: She is alert and oriented to person, place, and time.  Psychiatric:        Mood and Affect: Mood normal.        Behavior: Behavior normal.     Health Maintenance Due  Topic Date Due   Hepatitis C Screening  Never done   DTaP/Tdap/Td (1 - Tdap) Never done   Hepatitis B Vaccines 19-59 Average Risk (1 of 3 - 19+ 3-dose series) Never done   Cervical Cancer Screening (Pap smear)  Never done   HPV VACCINES (1 - 3-dose SCDM series) Never done   Influenza Vaccine  Never done       Topic Date Due   Hepatitis B Vaccines 19-59 Average Risk (1 of 3 - 19+ 3-dose series) Never done   HPV VACCINES (1 - 3-dose SCDM series) Never done     Lab Results  Component Value Date   TSH 1.160 06/26/2022   Lab Results  Component Value Date   WBC 16.0 (H) 12/31/2023   HGB 11.1 (L) 12/31/2023   HCT 34.1 (L) 12/31/2023   MCV 89.3 12/31/2023   PLT 189  12/31/2023   Lab Results  Component Value Date   NA 135 12/30/2023   K 3.6 12/30/2023   CO2 18 (L) 12/30/2023   GLUCOSE 171 (H) 12/30/2023   BUN 10 12/30/2023   CREATININE 0.75 12/30/2023   BILITOT 0.5 12/30/2023   ALKPHOS 90 12/30/2023   AST 36 12/30/2023   ALT 27 12/30/2023   PROT 6.4 (L) 12/30/2023   ALBUMIN 2.6 (L) 12/30/2023   CALCIUM 9.4 12/30/2023   ANIONGAP 12 12/30/2023   EGFR 98 06/26/2022   Lab Results  Component Value Date   CHOL 152 11/08/2021   Lab Results  Component Value Date   HDL 45 11/08/2021   Lab Results  Component Value Date   LDLCALC 90 11/08/2021   Lab Results  Component Value Date   TRIG 89 11/08/2021   Lab Results  Component Value Date   CHOLHDL 3.4 11/08/2021   No results found for: HGBA1C      Results for orders placed or performed during the hospital encounter of 12/30/23  OB RESULTS CONSOLE GC/Chlamydia   Collection Time: 06/18/23 12:00 AM  Result Value Ref Range   Chlamydia Negative    Neisseria Gonorrhea Negative   OB RESULTS CONSOLE RPR   Collection Time: 06/18/23 12:00 AM  Result Value Ref Range   RPR Nonreactive   OB RESULTS CONSOLE HIV antibody   Collection Time: 06/18/23 12:00 AM  Result Value Ref Range   HIV Non-reactive   OB RESULTS CONSOLE Rubella Antibody   Collection Time: 06/18/23 12:00 AM  Result Value Ref Range   Rubella Immune   OB RESULTS CONSOLE Hepatitis B surface antigen   Collection Time: 06/18/23 12:00 AM  Result Value Ref Range   Hepatitis B Surface Ag Negative   OB RESULT CONSOLE Group B Strep   Collection Time: 12/09/23 12:00 AM  Result Value Ref Range   GBS Negative   CBC   Collection Time: 12/30/23  6:38 AM  Result Value Ref Range   WBC 9.2 4.0 - 10.5 K/uL   RBC 4.01 3.87 - 5.11 MIL/uL   Hemoglobin 11.7 (L) 12.0 - 15.0 g/dL   HCT 64.1 (L) 63.9 - 53.9 %   MCV 89.3 80.0 - 100.0 fL   MCH 29.2 26.0 - 34.0 pg   MCHC 32.7 30.0 - 36.0 g/dL   RDW 86.0 88.4 - 84.4 %   Platelets 212 150 -  400 K/uL   nRBC 0.0 0.0 - 0.2 %  RPR   Collection Time: 12/30/23  6:38 AM  Result Value Ref Range   RPR Ser Ql NON REACTIVE NON REACTIVE  Comprehensive metabolic panel   Collection Time: 12/30/23  6:38 AM  Result Value Ref Range   Sodium 135 135 - 145 mmol/L   Potassium 3.6 3.5 - 5.1 mmol/L   Chloride 105 98 - 111 mmol/L   CO2 18 (L) 22 - 32 mmol/L   Glucose, Bld 171 (H) 70 - 99 mg/dL   BUN 10 6 - 20 mg/dL   Creatinine, Ser 9.24 0.44 - 1.00 mg/dL   Calcium 9.4 8.9 - 89.6 mg/dL   Total Protein 6.4 (L) 6.5 - 8.1 g/dL   Albumin 2.6 (L) 3.5 - 5.0 g/dL   AST 36 15 - 41 U/L   ALT 27 0 - 44 U/L   Alkaline Phosphatase 90 38 - 126 U/L   Total Bilirubin 0.5 0.0 - 1.2 mg/dL   GFR, Estimated >39 >39 mL/min   Anion gap 12 5 - 15  Type and screen   Collection Time: 12/30/23  6:38 AM  Result Value Ref Range   ABO/RH(D) A POS    Antibody Screen NEG    Sample Expiration      01/02/2024,2359 Performed at [facility], [address], [place], [place] [address]   Glucose, capillary   Collection Time: 12/30/23 12:25 PM  Result Value Ref Range   Glucose-Capillary 75 70 - 99 mg/dL  Glucose, capillary   Collection Time: 12/30/23  6:46 PM  Result Value Ref Range   Glucose-Capillary 88 70 - 99 mg/dL  CBC   Collection Time: 12/31/23  5:16 AM  Result Value Ref Range   WBC 16.0 (H) 4.0 - 10.5 K/uL   RBC 3.82 (L) 3.87 - 5.11 MIL/uL   Hemoglobin 11.1 (L) 12.0 - 15.0 g/dL   HCT 65.8 (L) 63.9 - 53.9 %   MCV 89.3 80.0 - 100.0 fL   MCH 29.1 26.0 - 34.0 pg   MCHC 32.6 30.0 - 36.0 g/dL   RDW 85.9 88.4 - 84.4 %   Platelets 189 150 - 400 K/uL   nRBC 0.0 0.0 - 0.2 %  Glucose, capillary   Collection Time: 12/31/23  3:37 PM  Result Value Ref Range   Glucose-Capillary 124 (H) 70 - 99 mg/dL  Glucose, capillary   Collection Time: 12/31/23 10:50 PM  Result Value Ref Range   Glucose-Capillary 120 (H) 70 - 99 mg/dL  Glucose, capillary   Collection Time: 01/01/24  5:51 AM  Result Value Ref  Range   Glucose-Capillary 113 (H) 70 - 99 mg/dL  Glucose, capillary   Collection Time: 01/01/24 12:33 PM  Result Value Ref Range  Glucose-Capillary 107 (H) 70 - 99 mg/dL  Glucose, capillary   Collection Time: 01/01/24 10:33 PM  Result Value Ref Range   Glucose-Capillary 114 (H) 70 - 99 mg/dL   Comment 1 Document in Chart   Glucose, capillary   Collection Time: 01/02/24  5:27 AM  Result Value Ref Range   Glucose-Capillary 81 70 - 99 mg/dL  Glucose, capillary   Collection Time: 01/02/24 12:42 PM  Result Value Ref Range   Glucose-Capillary 104 (H) 70 - 99 mg/dL  Glucose, capillary   Collection Time: 01/02/24 10:09 PM  Result Value Ref Range   Glucose-Capillary 112 (H) 70 - 99 mg/dL  Glucose, capillary   Collection Time: 01/03/24  5:36 AM  Result Value Ref Range   Glucose-Capillary 92 70 - 99 mg/dL     Assessment & Plan:   Assessment & Plan Acute cough Acute upper respiratory infection - Prescribed Z-Pak (azithromycin ), safe for breastfeeding. - Advised to report worsening symptoms or severe cough. Orders:   POC COVID-19 BinaxNow   azithromycin  (ZITHROMAX ) 250 MG tablet; Take 2 tablets on day 1, then 1 tablet daily on days 2 through 5   Refill sent for Zoloft  Denies any new or worsening symptoms Continue to take Zoloft  50mg  as prescribed Schedule follow up with Camie Moats for [facility] and chronic visit  Body mass index is 33.75 kg/m.SABRA  No orders of the defined types were placed in this encounter.   No orders of the defined types were placed in this encounter.    Follow-up: No follow-ups on file.  An After Visit Summary was printed and given to the patient.    I,Lauren M Auman,acting as a neurosurgeon for [employer]have documented all relevant documentation on the behalf of Nola Angles, PA,as directed by  Nola Angles, PA while in the presence of Nola Angles, [place].    Nola Angles, [place] [facility] [phone]     [1]  Allergies Allergen  Reactions   Keflex [Cephalexin] Hives   "

## 2024-10-27 ENCOUNTER — Encounter: Admitting: Physician Assistant
